# Patient Record
Sex: Female | Born: 1955
Health system: Southern US, Community
[De-identification: ages and names within clinical notes are randomized; demographics above are authoritative.]

## PROBLEM LIST (undated history)

## (undated) DIAGNOSIS — T7840XA Allergy, unspecified, initial encounter: Secondary | ICD-10-CM

## (undated) DIAGNOSIS — E079 Disorder of thyroid, unspecified: Secondary | ICD-10-CM

## (undated) HISTORY — PX: HERNIA REPAIR: SHX51

## (undated) HISTORY — DX: Allergy, unspecified, initial encounter: T78.40XA

## (undated) HISTORY — DX: Disorder of thyroid, unspecified: E07.9

---

## 1997-11-11 ENCOUNTER — Ambulatory Visit: Admission: RE | Admit: 1997-11-11 | Discharge: 1997-11-11 | Payer: Self-pay

## 1999-07-22 ENCOUNTER — Ambulatory Visit (HOSPITAL_COMMUNITY): Admission: RE | Admit: 1999-07-22 | Discharge: 1999-07-22 | Payer: Self-pay | Admitting: Family Medicine

## 1999-07-22 ENCOUNTER — Encounter: Payer: Self-pay | Admitting: Family Medicine

## 2000-08-31 ENCOUNTER — Emergency Department (HOSPITAL_COMMUNITY): Admission: EM | Admit: 2000-08-31 | Discharge: 2000-08-31 | Payer: Self-pay | Admitting: Emergency Medicine

## 2000-10-11 ENCOUNTER — Encounter: Admission: RE | Admit: 2000-10-11 | Discharge: 2000-10-11 | Payer: Self-pay | Admitting: Family Medicine

## 2000-10-11 ENCOUNTER — Encounter: Payer: Self-pay | Admitting: Family Medicine

## 2000-10-17 ENCOUNTER — Ambulatory Visit (HOSPITAL_COMMUNITY): Admission: RE | Admit: 2000-10-17 | Discharge: 2000-10-17 | Payer: Self-pay | Admitting: *Deleted

## 2000-10-17 ENCOUNTER — Encounter (INDEPENDENT_AMBULATORY_CARE_PROVIDER_SITE_OTHER): Payer: Self-pay | Admitting: *Deleted

## 2000-10-17 ENCOUNTER — Encounter: Payer: Self-pay | Admitting: *Deleted

## 2001-03-29 ENCOUNTER — Encounter: Admission: RE | Admit: 2001-03-29 | Discharge: 2001-03-29 | Payer: Self-pay | Admitting: Family Medicine

## 2001-03-29 ENCOUNTER — Encounter: Payer: Self-pay | Admitting: Family Medicine

## 2001-05-07 ENCOUNTER — Encounter: Admission: RE | Admit: 2001-05-07 | Discharge: 2001-05-07 | Payer: Self-pay | Admitting: Endocrinology

## 2001-05-07 ENCOUNTER — Encounter: Payer: Self-pay | Admitting: Endocrinology

## 2003-12-29 ENCOUNTER — Other Ambulatory Visit: Admission: RE | Admit: 2003-12-29 | Discharge: 2003-12-29 | Payer: Self-pay | Admitting: Family Medicine

## 2005-02-24 ENCOUNTER — Other Ambulatory Visit: Admission: RE | Admit: 2005-02-24 | Discharge: 2005-02-24 | Payer: Self-pay | Admitting: Family Medicine

## 2008-11-21 ENCOUNTER — Emergency Department (HOSPITAL_BASED_OUTPATIENT_CLINIC_OR_DEPARTMENT_OTHER): Admission: EM | Admit: 2008-11-21 | Discharge: 2008-11-21 | Payer: Self-pay | Admitting: Emergency Medicine

## 2008-11-21 ENCOUNTER — Ambulatory Visit: Payer: Self-pay | Admitting: Diagnostic Radiology

## 2010-01-06 ENCOUNTER — Ambulatory Visit: Payer: Self-pay | Admitting: Family Medicine

## 2010-01-06 DIAGNOSIS — E039 Hypothyroidism, unspecified: Secondary | ICD-10-CM | POA: Insufficient documentation

## 2010-01-07 LAB — CONVERTED CEMR LAB: TSH: 0.919 microintl units/mL (ref 0.350–4.500)

## 2010-02-03 ENCOUNTER — Ambulatory Visit: Payer: Self-pay | Admitting: Family Medicine

## 2010-02-03 DIAGNOSIS — R030 Elevated blood-pressure reading, without diagnosis of hypertension: Secondary | ICD-10-CM

## 2010-02-03 DIAGNOSIS — I10 Essential (primary) hypertension: Secondary | ICD-10-CM | POA: Insufficient documentation

## 2010-06-08 NOTE — Assessment & Plan Note (Signed)
Summary: CHECK UP AND BLOODWORK/JBB   Vital Signs:  Patient profile:   55 year old female Height:      60 inches Weight:      125 pounds O2 Sat:      100 % on Room air Temp:     98.3 degrees F oral Pulse rate:   78 / minute Pulse rhythm:   regular Resp:     18 per minute BP sitting:   149 / 93  (right arm)  Vitals Entered By: Levonne Spiller EMT-P (February 03, 2010 11:37 AM)  O2 Flow:  Room air  Reason for Visit Follow-up  Allergies: 1)  ! * Archivist PMH-FH-SH reviewed for relevance   Complete Medication List: 1)  Levothroid 50 Mcg Tabs (Levothyroxine sodium) .Marland Kitchen.. 1 by mouth qam for thyroid Prescriptions: LEVOTHROID 50 MCG TABS (LEVOTHYROXINE SODIUM) 1 by mouth qam for thyroid  #90 x 2   Entered and Authorized by:   Tacey Ruiz MD   Signed by:   Tacey Ruiz MD on 02/03/2010   Method used:   Print then Give to Patient   RxID:   [id number]   History of Present Illness History from: patient Reason for visit: see chief complaint Chief Complaint: f/u hypothyroid History of Present Illness: She was restarted on levothyroxine about 6 weeks ago by this office. She returnted today for f/u. After she left, last visit, we received her lab results that revealed that she actually had a normal thyroid. The patient, however, reported feeling 100% better and has continued to take the prescription. She reports knowing the symptoms and risks of become hyperthyroid, but reports that she is willing to take that chance.   She says, however, that she cannot afford blood work today.    Physical Exam General appearance: well developed, well nourished, no acute distress Eyes: conjunctivae and lids normal Pupils: equal, round, reactive to light Neck: neck supple,  trachea midline, no masses Thyroid: diffuse enlargement Chest/Lungs: no rales, wheezes, or rhonchi bilateral, breath sounds equal without effort Heart: regular rate and  rhythm, no murmur Neurological: grossly intact and  non-focal - patellar reflexes 2+ equal Skin: no obvious rashes or lesions MSE: oriented to time, place, and person   REVIEW OF SYSTEMS Constitutional Symptoms      Denies fever, chills, night sweats, weight loss, weight gain, and fatigue.  Eyes       Denies change in vision, eye pain, eye discharge, glasses, contact lenses, and eye surgery. Ear/Nose/Throat/Mouth       Denies hearing loss/aids, change in hearing, ear pain, ear discharge, dizziness, frequent runny nose, frequent nose bleeds, sinus problems, sore throat, hoarseness, and tooth pain or bleeding.  Respiratory       Denies dry cough, productive cough, wheezing, shortness of breath, asthma, bronchitis, and emphysema/COPD.  Cardiovascular       Denies murmurs, chest pain, and tires easily with exhertion.    Gastrointestinal       Denies stomach pain, nausea/vomiting, diarrhea, constipation, blood in bowel movements, and indigestion. Genitourniary       Denies painful urination, kidney stones, and loss of urinary control. Neurological       Denies paralysis, seizures, and fainting/blackouts. Musculoskeletal       Denies muscle pain, joint pain, joint stiffness, decreased range of motion, redness, swelling, muscle weakness, and gout.  Skin       Denies bruising, unusual mles/lumps or sores, and hair/skin or nail changes.  Psych  Complains of mood changes.      Denies temper/anger issues, anxiety/stress, speech problems, depression, and sleep problems.      Comments: improved!  Assessment New Problems: ELEVATED BP READING WITHOUT DX HYPERTENSION (ICD-796.2)   Plan New Medications/Changes: LEVOTHROID 50 MCG TABS (LEVOTHYROXINE SODIUM) 1 by mouth qam for thyroid  #90 x 2, 02/03/2010, Tacey Ruiz MD  New Orders: Est. Patient Level II [phone] Planning Comments:   Patient was again urged to get labwork in the next 2-3 months. She was cautioned on symptoms of hyperthyroidism and possible long term but she again is willing  to accept the risks.  She was also counseled on high blood pressure, decreasing salt intake and exercise. She was urged to check her blood pressures regularly (Qmonth) and to return if pattern of elevation continues. She voiced understanding.   The patient and/or caregiver has been counseled thoroughly with regard to medications prescribed including dosage, schedule, interactions, rationale for use, and possible side effects and they verbalize understanding.  Diagnoses and expected course of recovery discussed and will return if not improved as expected or if the condition worsens. Patient and/or caregiver verbalized understanding.   The patient was informed that there is no on-call provider or services available at this clinic during off-hours (when the clinic is closed).  If the patient developed a problem or concern that required immediate attention, the patient was advised to go the the nearest available urgent care or emergency department for medical care.  The patient verbalized understanding.     The risks, benefits and possible side effects of the treatments and tests were explained clearly to the patient and the patient verbalized understanding.

## 2010-06-08 NOTE — Assessment & Plan Note (Signed)
Summary: THYROID MEDICATION/JBB   Vital Signs:  Patient Profile:   55 Years Old Female CC:      Rx Refill, Tdap injection / rwt Height:     61 inches Weight:      121 pounds BMI:     22.95 O2 Sat:      99 % O2 treatment:    Room Air Temp:     97.3 degrees F oral Pulse rate:   65 / minute Pulse rhythm:   regular Resp:     18 per minute BP sitting:   147 / 83  (left arm)  Pt. in pain?   no  Vitals Entered By: Levonne Spiller EMT-P (January 06, 2010 12:54 PM)              Is Patient Diabetic? No      Current Allergies: ! * FIRE ANTS History of Present Illness History from: patient Reason for visit: see chief complaint Chief Complaint: Rx Refill, Tdap injection / rwt History of Present Illness: patient has not seen a doctor in years. She does not have insurance and is without a job for years. She used to take throid medication daily but has not had any x about 3 years, secondary to above reasons. She has been starting to feel very sluggish, fatigued, and "blah". She comes in today, because she states that she cannot take it any more. Denies SI/HI.   She also tends to horses and reports being bit by one recently and having a puncture wound from a horse shoe while changing it. She would like a tetnus shot.  REVIEW OF SYSTEMS Constitutional Symptoms       Complains of weight gain and fatigue.     Denies fever, chills, night sweats, and weight loss.  Eyes       Complains of change in vision and glasses.      Denies eye pain, eye discharge, contact lenses, and eye surgery.      Comments: blurred vision Ear/Nose/Throat/Mouth       Denies hearing loss/aids, change in hearing, ear pain, ear discharge, dizziness, frequent runny nose, frequent nose bleeds, sinus problems, sore throat, hoarseness, and tooth pain or bleeding.  Respiratory       Denies dry cough, productive cough, wheezing, shortness of breath, asthma, bronchitis, and emphysema/COPD.  Cardiovascular       Complains of  tires easily with exhertion.      Denies murmurs and chest pain.    Gastrointestinal       Denies stomach pain, nausea/vomiting, diarrhea, constipation, blood in bowel movements, and indigestion. Genitourniary       Denies painful urination, blood or discharge from vagina, kidney stones, and loss of urinary control. Neurological       Denies paralysis, seizures, and fainting/blackouts.   Psych       Complains of depression.      Denies mood changes, temper/anger issues, anxiety/stress, speech problems, and sleep problems.  Family History: Family History Diabetes 1st degree relative (father)  Social History: quit smoking - 15 years ago Alcohol use-yes occ Physical Exam General appearance: well developed, well nourished, no acute distress Oral/Pharynx: tongue normal, posterior pharynx without erythema or exudate Neck: neck supple,  trachea midline, no masses Thyroid: no nodules, masses, or tenderness. slightly enlarged throid. Chest/Lungs: no rales, wheezes, or rhonchi bilateral, breath sounds equal without effort Heart: regular rate and  rhythm, no murmur Skin: several clean scars on legs/arms. nontender MSE: oriented to time, place, and person  Assessment New Problems: NEED PROPH VACCINATION W/TETANUS TOXOID ALONE (ICD-V03.7) UNSPECIFIED HYPOTHYROIDISM (ICD-244.9) FAMILY HISTORY DIABETES 1ST DEGREE RELATIVE (ICD-V18.0)   Plan New Medications/Changes: LEVOTHROID 50 MCG TABS (LEVOTHYROXINE SODIUM) 1 by mouth qam for thyroid  #30 x 1, 01/06/2010, Tacey Ruiz MD  New Orders: New Patient Level II [99202] Tetanus Toxoid w/Dx [86578] T-TSH [46962-95284] Planning Comments:   Was told to return to clinic in 5-6 weeks to have TSH level re-checked.   The patient and/or caregiver has been counseled thoroughly with regard to medications prescribed including dosage, schedule, interactions, rationale for use, and possible side effects and they verbalize understanding.  Diagnoses and  expected course of recovery discussed and will return if not improved as expected or if the condition worsens. Patient and/or caregiver verbalized understanding.  Prescriptions: LEVOTHROID 50 MCG TABS (LEVOTHYROXINE SODIUM) 1 by mouth qam for thyroid  #30 x 1   Entered and Authorized by:   Tacey Ruiz MD   Signed by:   Tacey Ruiz MD on 01/06/2010   Method used:   Electronically to        [facility] (retail)       [address]       [place], [place]  [address]       Ph: [phone]       Fax: [phone]   RxID:   [id number]   Medication Administration  Injection # 1:    Medication: TD    Diagnosis: Booster    Route: IM    Site: L deltoid    Exp Date: 03/24/2011    Lot #: [id number]    Mfr: Sanofi Pasteur    Patient tolerated injection without complications    Given by: Levonne Spiller EMT-P (January 06, 2010 1:39 PM)  Orders Added: 1)  New Patient Level II [99202] 2)  Tetanus Toxoid w/Dx [01601] 3)  T-TSH [09323-55732]  The risks, benefits and possible side effects of the treatments and tests were explained clearly to the patient and the patient verbalized understanding.  The patient was informed that there is no on-call provider or services available at this clinic during off-hours (when the clinic is closed).  If the patient developed a problem or concern that required immediate attention, the patient was advised to go the the nearest available urgent care or emergency department for medical care.  The patient verbalized understanding.

## 2012-11-08 ENCOUNTER — Emergency Department: Payer: Self-pay | Admitting: Emergency Medicine

## 2012-11-17 ENCOUNTER — Ambulatory Visit (INDEPENDENT_AMBULATORY_CARE_PROVIDER_SITE_OTHER): Payer: BC Managed Care – PPO | Admitting: Family Medicine

## 2012-11-17 VITALS — BP 132/90 | HR 70 | Temp 97.9°F | Resp 18 | Ht 61.0 in | Wt 136.0 lb

## 2012-11-17 DIAGNOSIS — R635 Abnormal weight gain: Secondary | ICD-10-CM

## 2012-11-17 DIAGNOSIS — Z862 Personal history of diseases of the blood and blood-forming organs and certain disorders involving the immune mechanism: Secondary | ICD-10-CM

## 2012-11-17 DIAGNOSIS — S7001XA Contusion of right hip, initial encounter: Secondary | ICD-10-CM

## 2012-11-17 DIAGNOSIS — S7011XA Contusion of right thigh, initial encounter: Secondary | ICD-10-CM

## 2012-11-17 DIAGNOSIS — Z0000 Encounter for general adult medical examination without abnormal findings: Secondary | ICD-10-CM

## 2012-11-17 DIAGNOSIS — N959 Unspecified menopausal and perimenopausal disorder: Secondary | ICD-10-CM

## 2012-11-17 DIAGNOSIS — S7010XA Contusion of unspecified thigh, initial encounter: Secondary | ICD-10-CM

## 2012-11-17 DIAGNOSIS — R030 Elevated blood-pressure reading, without diagnosis of hypertension: Secondary | ICD-10-CM

## 2012-11-17 DIAGNOSIS — Z8639 Personal history of other endocrine, nutritional and metabolic disease: Secondary | ICD-10-CM

## 2012-11-17 LAB — LIPID PANEL
Cholesterol: 205 mg/dL — ABNORMAL HIGH (ref 0–200)
HDL: 76 mg/dL (ref >39)
LDL Cholesterol: 113 mg/dL — ABNORMAL HIGH (ref 0–99)
Total CHOL/HDL Ratio: 2.7 Ratio
Triglycerides: 78 mg/dL (ref <150)
VLDL: 16 mg/dL (ref 0–40)

## 2012-11-17 LAB — COMPREHENSIVE METABOLIC PANEL
ALT: 19 U/L (ref 0–35)
AST: 22 U/L (ref 0–37)
Albumin: 4.4 g/dL (ref 3.5–5.2)
Alkaline Phosphatase: 78 U/L (ref 39–117)
BUN: 13 mg/dL (ref 6–23)
CO2: 29 mEq/L (ref 19–32)
Calcium: 9.6 mg/dL (ref 8.4–10.5)
Chloride: 103 mEq/L (ref 96–112)
Creat: 1.05 mg/dL (ref 0.50–1.10)
Glucose, Bld: 91 mg/dL (ref 70–99)
Potassium: 3.9 mEq/L (ref 3.5–5.3)
Sodium: 141 mEq/L (ref 135–145)
Total Bilirubin: 0.7 mg/dL (ref 0.3–1.2)
Total Protein: 7.5 g/dL (ref 6.0–8.3)

## 2012-11-17 LAB — POCT CBC
Granulocyte percent: 68.8 %G (ref 37–80)
HCT, POC: 44.8 % (ref 37.7–47.9)
Hemoglobin: 14.4 g/dL (ref 12.2–16.2)
Lymph, poc: 1.8 (ref 0.6–3.4)
MCH, POC: 29.2 pg (ref 27–31.2)
MCHC: 32.1 g/dL (ref 31.8–35.4)
MCV: 90.8 fL (ref 80–97)
MID (cbc): 0.5 (ref 0–0.9)
MPV: 7.8 fL (ref 0–99.8)
POC Granulocyte: 5 (ref 2–6.9)
POC LYMPH PERCENT: 24.2 %L (ref 10–50)
POC MID %: 7 %M (ref 0–12)
Platelet Count, POC: 320 10*3/uL (ref 142–424)
RBC: 4.93 M/uL (ref 4.04–5.48)
RDW, POC: 14 %
WBC: 7.3 10*3/uL (ref 4.6–10.2)

## 2012-11-17 LAB — TSH: TSH: 1.077 u[IU]/mL (ref 0.350–4.500)

## 2012-11-17 LAB — POCT GLYCOSYLATED HEMOGLOBIN (HGB A1C): Hemoglobin A1C: 5.2

## 2012-11-17 LAB — IFOBT (OCCULT BLOOD): IFOBT: NEGATIVE

## 2012-11-17 MED ORDER — HYDROCODONE-ACETAMINOPHEN 5-325 MG PO TABS: 1.0000 | ORAL_TABLET | Freq: Four times a day (QID) | ORAL | Status: DC | PRN

## 2012-11-17 NOTE — Patient Instructions (Addendum)
Call Dr. Loreta Ave or Dr Elnoria Howard, gastroenterology, to schedule screening colonoscopy  [phone]  [facility] to schedule screening mammogram  [phone]   If the contusion and hematoma on the hip get acutely redder or more painful get them rechecked.  Continue to monitor your blood pressure. No treatment is needed at this time. She needs to work on weight loss I continue to try and exercise regularly and to eat less  Return if further problems arise.
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
## 2012-11-19 LAB — PAP IG (IMAGE GUIDED)

## 2013-09-12 ENCOUNTER — Ambulatory Visit (INDEPENDENT_AMBULATORY_CARE_PROVIDER_SITE_OTHER): Payer: 59 | Admitting: Family Medicine

## 2013-09-12 VITALS — BP 124/74 | HR 63 | Temp 98.1°F | Resp 16 | Ht 61.0 in | Wt 112.2 lb

## 2013-09-12 DIAGNOSIS — Z0000 Encounter for general adult medical examination without abnormal findings: Secondary | ICD-10-CM

## 2013-09-12 DIAGNOSIS — E079 Disorder of thyroid, unspecified: Secondary | ICD-10-CM

## 2013-09-12 DIAGNOSIS — Z1231 Encounter for screening mammogram for malignant neoplasm of breast: Secondary | ICD-10-CM

## 2013-09-12 DIAGNOSIS — Z1211 Encounter for screening for malignant neoplasm of colon: Secondary | ICD-10-CM

## 2013-09-12 DIAGNOSIS — Z13220 Encounter for screening for lipoid disorders: Secondary | ICD-10-CM

## 2013-09-12 LAB — POCT UA - MICROSCOPIC ONLY
Bacteria, U Microscopic: NEGATIVE
Casts, Ur, LPF, POC: NEGATIVE
Crystals, Ur, HPF, POC: NEGATIVE
Mucus, UA: NEGATIVE
RBC, urine, microscopic: NEGATIVE
WBC, Ur, HPF, POC: NEGATIVE
Yeast, UA: NEGATIVE

## 2013-09-12 LAB — COMPREHENSIVE METABOLIC PANEL
ALT: 14 U/L (ref 0–35)
AST: 16 U/L (ref 0–37)
Albumin: 4.4 g/dL (ref 3.5–5.2)
Alkaline Phosphatase: 69 U/L (ref 39–117)
BUN: 16 mg/dL (ref 6–23)
CO2: 28 mEq/L (ref 19–32)
Calcium: 9.3 mg/dL (ref 8.4–10.5)
Chloride: 103 mEq/L (ref 96–112)
Creat: 0.9 mg/dL (ref 0.50–1.10)
Glucose, Bld: 93 mg/dL (ref 70–99)
Potassium: 3.9 mEq/L (ref 3.5–5.3)
Sodium: 140 mEq/L (ref 135–145)
Total Bilirubin: 0.7 mg/dL (ref 0.2–1.2)
Total Protein: 7.2 g/dL (ref 6.0–8.3)

## 2013-09-12 LAB — LIPID PANEL
Cholesterol: 174 mg/dL (ref 0–200)
HDL: 78 mg/dL (ref >39)
LDL Cholesterol: 85 mg/dL (ref 0–99)
Total CHOL/HDL Ratio: 2.2 Ratio
Triglycerides: 57 mg/dL (ref <150)
VLDL: 11 mg/dL (ref 0–40)

## 2013-09-12 LAB — POCT URINALYSIS DIPSTICK
Bilirubin, UA: NEGATIVE
Blood, UA: NEGATIVE
Glucose, UA: NEGATIVE
Ketones, UA: NEGATIVE
Leukocytes, UA: NEGATIVE
Nitrite, UA: NEGATIVE
Protein, UA: NEGATIVE
Spec Grav, UA: <=1.005
Urobilinogen, UA: 0.2
pH, UA: 7

## 2013-09-12 LAB — POCT CBC
Granulocyte percent: 69.9 %G (ref 37–80)
HCT, POC: 43.7 % (ref 37.7–47.9)
Hemoglobin: 14.3 g/dL (ref 12.2–16.2)
Lymph, poc: 1.8 (ref 0.6–3.4)
MCH, POC: 29.2 pg (ref 27–31.2)
MCHC: 32.7 g/dL (ref 31.8–35.4)
MCV: 89.4 fL (ref 80–97)
MID (cbc): 0.4 (ref 0–0.9)
MPV: 8.3 fL (ref 0–99.8)
POC Granulocyte: 5.2 (ref 2–6.9)
POC LYMPH PERCENT: 24.4 %L (ref 10–50)
POC MID %: 6 %M (ref 0–12)
Platelet Count, POC: 324 10*3/uL (ref 142–424)
RBC: 4.89 M/uL (ref 4.04–5.48)
RDW, POC: 14.8 %
WBC: 7.4 10*3/uL (ref 4.6–10.2)

## 2013-09-12 LAB — TSH: TSH: 0.767 u[IU]/mL (ref 0.350–4.500)

## 2013-09-12 NOTE — Progress Notes (Signed)
Chief Complaint:  Chief Complaint  Patient presents with  . Annual Exam    for insurance     HPI: Carrie Beard is a 58 y.o. female who is here for annual visit She is doing well. 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 Allergy   . Thyroid disease    Past Surgical History  Procedure Laterality Date  . Hernia repair     History   Social History  . Marital Status: Single    Spouse Name: N/A    Number of Children: N/A  . Years of Education: N/A   Social History Main Topics  . Smoking status: Former Games developermoker  . Smokeless tobacco: None  . Alcohol Use: Yes  . Drug Use: No  . Sexual Activity: No   Other Topics Concern  . None   Social History Narrative  . None   Family History  Problem Relation Age of Onset  . Hypertension Mother   . Diabetes Father    No Known Allergies Prior to Admission medications   Medication Sig Start Date End Date Taking? Authorizing Provider  HYDROcodone-acetaminophen (NORCO) 5-325 MG per tablet Take 1 tablet by mouth every 6 (six) hours as needed for pain. One every 4-6 hours for severe pain only. 11/17/12   Peyton Najjaravid H Hopper, MD  oxyCODONE-acetaminophen (PERCOCET/ROXICET) 5-325 MG per tablet Take 1 tablet by mouth every 4 (four) hours as needed for pain.    Historical Provider, MD     ROS: The patient denies fevers, chills, night sweats, unintentional weight loss, chest pain, palpitations, wheezing, dyspnea on exertion, nausea, vomiting, abdominal pain, dysuria, hematuria, melena, numbness, weakness, or tingling.   All other systems have been reviewed and were otherwise negative with the exception of those mentioned in the HPI and as above.    PHYSICAL EXAM: Filed  Vitals:   09/12/13 0813  BP: 124/74  Pulse: 63  Temp: 98.1 F (36.7 C)  Resp: 16   Filed Vitals:   09/12/13 0813  Height: 5\' 1"  (1.549 m)  Weight: 112 lb 3.2 oz (50.894 kg)   Body mass index is 21.21 kg/(m^2).  General: Alert, no acute distress HEENT:  Normocephalic, atraumatic, oropharynx patent. EOMI, PERRLA Cardiovascular:  Regular rate and rhythm, no rubs murmurs or gallops.  No Carotid bruits, radial pulse intact. No pedal edema.  Respiratory: Clear to auscultation bilaterally.  No wheezes, rales, or rhonchi.  No cyanosis, no use of accessory musculature GI: No organomegaly, abdomen is soft and non-tender, positive bowel sounds.  No masses. Skin: No rashes. Neurologic: Facial musculature symmetric. Psychiatric: Patient is appropriate throughout our interaction. Lymphatic: No cervical lymphadenopathy Musculoskeletal: Gait intact. Breast exam normal Normal cervical exam, nulliparous cervix   LABS: Results for orders placed in visit on 09/12/13  POCT CBC      Result Value Ref Range   WBC 7.4  4.6 - 10.2 K/uL   Lymph, poc 1.8  0.6 - 3.4   POC LYMPH PERCENT 24.4  10 - 50 %L   MID (cbc) 0.4  0 - 0.9   POC MID % 6.0  0 - 12 %M   POC Granulocyte 5.2  2 - 6.9   Granulocyte percent 69.9  37 - 80 %G   RBC 4.89  4.04 - 5.48 M/uL   Hemoglobin 14.3  12.2 - 16.2 g/dL   HCT, POC 11.943.7  14.737.7 - 47.9 %   MCV 89.4  80 - 97 fL   MCH, POC 29.2  27 - 31.2 pg   MCHC 32.7  31.8 - 35.4 g/dL   RDW, POC 82.914.8     Platelet Count, POC 324  142 - 424 K/uL   MPV 8.3  0 - 99.8 fL  POCT UA - MICROSCOPIC ONLY      Result Value Ref Range   WBC, Ur, HPF, POC neg     RBC, urine, microscopic neg     Bacteria, U Microscopic neg     Mucus, UA neg     Epithelial cells, urine per micros 0-1     Crystals, Ur, HPF, POC neg     Casts, Ur, LPF, POC neg     Yeast, UA neg    POCT URINALYSIS DIPSTICK      Result Value Ref Range   Color, UA yellow     Clarity, UA clear     Glucose, UA neg      Bilirubin, UA neg     Ketones, UA neg     Spec Grav, UA <=1.005     Blood, UA neg     pH, UA 7.0     Protein, UA neg     Urobilinogen, UA 0.2     Nitrite, UA neg     Leukocytes, UA Negative       EKG/XRAY:   [facility]   ASSESSMENT/PLAN: Encounter Diagnoses  Name Primary?  . Annual physical exam Yes  . Screening for hyperlipidemia   . Thyroid disease   . Other screening mammogram   . Encounter for screening colonoscopy    Well woman exam  She is doing well Will get annual labs ad screening mammogram and also colonscopy Pap was normal in 2014 so will not get pap She has an insurance sheet that needs to be faxed.  F/u in 1 year  Gross sideeffects, risk and benefits, and alternatives of medications d/w patient. Patient is aware that all medications have potential sideeffects and we are unable to predict every sideeffect or drug-drug interaction that may occur.  Lenell Antuhao P Le, DO 09/12/2013 9:27 AM

## 2013-09-13 LAB — VITAMIN D 25 HYDROXY (VIT D DEFICIENCY, FRACTURES): Vit D, 25-Hydroxy: 26 ng/mL — ABNORMAL LOW (ref 30–89)

## 2013-10-04 ENCOUNTER — Telehealth: Payer: Self-pay | Admitting: *Deleted

## 2013-10-04 NOTE — Telephone Encounter (Signed)
[employer] form received from pt at May 7th visit needs MD signature and faxed back to [employer] before June 1.  Form brought down to Dr. Conley Rolls to sign and faxed to [employer]- sent to scanning.

## 2014-10-29 ENCOUNTER — Ambulatory Visit (INDEPENDENT_AMBULATORY_CARE_PROVIDER_SITE_OTHER): Payer: 59 | Admitting: Emergency Medicine

## 2014-10-29 VITALS — BP 150/80 | HR 75 | Temp 98.4°F | Resp 18 | Ht 61.0 in | Wt 122.0 lb

## 2014-10-29 DIAGNOSIS — R030 Elevated blood-pressure reading, without diagnosis of hypertension: Secondary | ICD-10-CM

## 2014-10-29 DIAGNOSIS — Z0000 Encounter for general adult medical examination without abnormal findings: Secondary | ICD-10-CM | POA: Diagnosis not present

## 2014-10-29 DIAGNOSIS — Z1329 Encounter for screening for other suspected endocrine disorder: Secondary | ICD-10-CM | POA: Diagnosis not present

## 2014-10-29 LAB — COMPLETE METABOLIC PANEL WITH GFR
ALT: 12 U/L (ref 0–35)
AST: 16 U/L (ref 0–37)
Albumin: 4.3 g/dL (ref 3.5–5.2)
Alkaline Phosphatase: 71 U/L (ref 39–117)
BUN: 16 mg/dL (ref 6–23)
CO2: 27 mEq/L (ref 19–32)
Calcium: 9.2 mg/dL (ref 8.4–10.5)
Chloride: 104 mEq/L (ref 96–112)
Creat: 0.96 mg/dL (ref 0.50–1.10)
GFR, Est African American: 76 mL/min
GFR, Est Non African American: 66 mL/min
Glucose, Bld: 100 mg/dL — ABNORMAL HIGH (ref 70–99)
Potassium: 4.2 mEq/L (ref 3.5–5.3)
Sodium: 139 mEq/L (ref 135–145)
Total Bilirubin: 0.7 mg/dL (ref 0.2–1.2)
Total Protein: 7.1 g/dL (ref 6.0–8.3)

## 2014-10-29 LAB — LIPID PANEL
Cholesterol: 196 mg/dL (ref 0–200)
HDL: 101 mg/dL (ref >=46)
LDL Cholesterol: 81 mg/dL (ref 0–99)
Total CHOL/HDL Ratio: 1.9 Ratio
Triglycerides: 71 mg/dL (ref <150)
VLDL: 14 mg/dL (ref 0–40)

## 2014-10-29 LAB — POCT URINALYSIS DIPSTICK
Bilirubin, UA: NEGATIVE
Blood, UA: NEGATIVE
Glucose, UA: NEGATIVE
Ketones, UA: NEGATIVE
Nitrite, UA: NEGATIVE
Protein, UA: NEGATIVE
Spec Grav, UA: <=1.005
Urobilinogen, UA: 0.2
pH, UA: 6

## 2014-10-29 LAB — POCT CBC
Granulocyte percent: 64.7 %G (ref 37–80)
HCT, POC: 41.9 % (ref 37.7–47.9)
Hemoglobin: 13.4 g/dL (ref 12.2–16.2)
Lymph, poc: 1.8 (ref 0.6–3.4)
MCH, POC: 27.8 pg (ref 27–31.2)
MCHC: 32.1 g/dL (ref 31.8–35.4)
MCV: 86.5 fL (ref 80–97)
MID (cbc): 0.4 (ref 0–0.9)
MPV: 6.6 fL (ref 0–99.8)
POC Granulocyte: 3.9 (ref 2–6.9)
POC LYMPH PERCENT: 29.2 %L (ref 10–50)
POC MID %: 6.1 %M (ref 0–12)
Platelet Count, POC: 253 10*3/uL (ref 142–424)
RBC: 4.84 M/uL (ref 4.04–5.48)
RDW, POC: 13.6 %
WBC: 6.1 10*3/uL (ref 4.6–10.2)

## 2014-10-29 LAB — POCT UA - MICROSCOPIC ONLY
Casts, Ur, LPF, POC: NEGATIVE
Crystals, Ur, HPF, POC: NEGATIVE
Mucus, UA: NEGATIVE
RBC, urine, microscopic: NEGATIVE
Yeast, UA: NEGATIVE

## 2014-10-29 LAB — TSH: TSH: 0.936 u[IU]/mL (ref 0.350–4.500)

## 2014-10-29 NOTE — Patient Instructions (Addendum)
We recommend that you schedule a mammogram for breast cancer screening. Typically, you do not need a referral to do this. Please contact a local imaging center to schedule your mammogram.  [facility] - [phone]  *ask for the Radiology Department [facility]) - [phone] or [phone]  [facility] - [phone] [facility] - [phone] [facility] - [phone]  *ask for the Radiology Department [facility] - [phone]  *ask for the Radiology Department [facility] - [phone]  *ask for the Mammography Department [facility] - [phone] Hypertension Hypertension, commonly called high blood pressure, is when the force of blood pumping through your arteries is too strong. Your arteries are the blood vessels that carry blood from your heart throughout your body. A blood pressure reading consists of a higher number over a lower number, such as 110/72. The higher number (systolic) is the pressure inside your arteries when your heart pumps. The lower number (diastolic) is the pressure inside your arteries when your heart relaxes. Ideally you want your blood pressure below 120/80. Hypertension forces your heart to work harder to pump blood. Your arteries may become narrow or stiff. Having hypertension puts you at risk for heart disease, stroke, and other problems.  RISK FACTORS Some risk factors for high blood pressure are controllable. Others are not.  Risk factors you cannot control include:   Race. You may be at higher risk if you are African American.  Age. Risk increases with age.  Gender. Men are at higher risk than women before age 88 years. After age 1, women are at higher risk than men. Risk factors you can control include:  Not getting enough exercise or physical activity.  Being overweight.  Getting too much fat, sugar, calories, or salt in your  diet.  Drinking too much alcohol. SIGNS AND SYMPTOMS Hypertension does not usually cause signs or symptoms. Extremely high blood pressure (hypertensive crisis) may cause headache, anxiety, shortness of breath, and nosebleed. DIAGNOSIS  To check if you have hypertension, your health care provider will measure your blood pressure while you are seated, with your arm held at the level of your heart. It should be measured at least twice using the same arm. Certain conditions can cause a difference in blood pressure between your right and left arms. A blood pressure reading that is higher than normal on one occasion does not mean that you need treatment. If one blood pressure reading is high, ask your health care provider about having it checked again. TREATMENT  Treating high blood pressure includes making lifestyle changes and possibly taking medicine. Living a healthy lifestyle can help lower high blood pressure. You may need to change some of your habits. Lifestyle changes may include:  Following the DASH diet. This diet is high in fruits, vegetables, and whole grains. It is low in salt, red meat, and added sugars.  Getting at least 2 hours of brisk physical activity every week.  Losing weight if necessary.  Not smoking.  Limiting alcoholic beverages.  Learning ways to reduce stress. If lifestyle changes are not enough to get your blood pressure under control, your health care provider may prescribe medicine. You may need to take more than one. Work closely with your health care provider to understand the risks and benefits. HOME CARE INSTRUCTIONS  Have your blood pressure rechecked as directed by your health care provider.   Take medicines only as directed  by your health care provider. Follow the directions carefully. Blood pressure medicines must be taken as prescribed. The medicine does not work as well when you skip doses. Skipping doses also puts you at risk for problems.   Do not  smoke.   Monitor your blood pressure at home as directed by your health care provider. SEEK MEDICAL CARE IF:   You think you are having a reaction to medicines taken.  You have recurrent headaches or feel dizzy.  You have swelling in your ankles.  You have trouble with your vision. SEEK IMMEDIATE MEDICAL CARE IF:  You develop a severe headache or confusion.  You have unusual weakness, numbness, or feel faint.  You have severe chest or abdominal pain.  You vomit repeatedly.  You have trouble breathing. MAKE SURE YOU:   Understand these instructions.  Will watch your condition.  Will get help right away if you are not doing well or get worse. Document Released: 04/25/2005 Document Revised: 09/09/2013 Document Reviewed: 02/15/2013 [facility] Patient Information 2015 [place], [place]. This information is not intended to replace advice given to you by your health care provider. Make sure you discuss any questions you have with your health care provider.

## 2014-10-29 NOTE — Progress Notes (Addendum)
Subjective:  This chart was scribed for Earl Lites, MD by Andrew Au, ED Scribe. This patient was seen in room 13 and the patient's care was started at 8:25 AM.  Patient ID: Carrie Beard, female    DOB: 20-Aug-1955, 59 y.o.   MRN: [id number]  HPI Chief Complaint  Patient presents with  . Annual Exam   HPI Comments: Carrie Beard is a 59 y.o. female who presents to the [facility] for an insurance physical. Pt is UTD on immunization. She had colonoscopy this year by Dr. Loreta Ave and pap smear was last year by Dr. Conley Rolls.  She has not yet scheduled her mammogram but plans to do so soon. She is seen once a year for a physical. She reports family hx of DM, HTN, colon cancer, and brain cancer. Pt is fasting this morning. 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   Past Medical History  Diagnosis Date  . Allergy   . Thyroid disease    Past Surgical History  Procedure Laterality Date  . Hernia repair     No Known Allergies Prior to Admission medications   Medication Sig Start Date End Date Taking? Authorizing Provider  cyclobenzaprine (FLEXERIL) 10 MG tablet Take 10 mg by mouth 3 (three) times daily as needed for muscle spasms.   Yes Historical Provider, MD  naproxen (NAPROSYN) 500 MG tablet Take 500 mg by mouth 2 (two) times daily with a meal.   Yes Historical Provider, MD  HYDROcodone-acetaminophen (NORCO) 5-325 MG per tablet Take 1 tablet by mouth every 6 (six) hours as needed for pain. One every 4-6 hours for severe pain only. Patient not taking: Reported on 10/29/2014 11/17/12   Peyton Najjar, MD  oxyCODONE-acetaminophen (PERCOCET/ROXICET) 5-325 MG per tablet Take 1 tablet by mouth every 4 (four) hours as needed for pain.    Historical Provider, MD   Review of Systems  All other systems reviewed and are negative.    Objective:   Physical Exam CONSTITUTIONAL: Well developed/well nourished HEAD: Normocephalic/atraumatic EYES: EOMI/PERRL ENMT: Mucous membranes  moist NECK: supple no meningeal signs SPINE/BACK:entire spine nontender CV: S1/S2 noted, no murmurs/rubs/gallops noted, BP-160/84 LUNGS: Lungs are clear to auscultation bilaterally, no apparent distress ABDOMEN: soft, nontender, no rebound or guarding, bowel sounds noted throughout abdomen GU:no cva tenderness NEURO: Pt is awake/alert/appropriate, moves all extremitiesx4.  No facial droop.   EXTREMITIES: pulses normal/equal, full ROM SKIN: warm, color normal PSYCH: no abnormalities of mood noted, alert and oriented to situation  Filed Vitals:   10/29/14 0812  BP: 160/80  Pulse: 75  Temp: 98.4 F (36.9 C)  Resp: 18   Results for orders placed or performed in visit on 10/29/14  POCT urinalysis dipstick  Result Value Ref Range   Color, UA yellow    Clarity, UA clear    Glucose, UA neg    Bilirubin, UA neg    Ketones, UA neg    Spec Grav, UA <=1.005    Blood, UA neg    pH, UA 6.0    Protein, UA neg    Urobilinogen, UA 0.2    Nitrite, UA neg    Leukocytes, UA small (1+) (A) Negative  POCT CBC  Result Value Ref Range   WBC 6.1 4.6 - 10.2 K/uL   Lymph, poc 1.8 0.6 - 3.4   POC LYMPH PERCENT 29.2 10 - 50 %L   MID (cbc) 0.4 0 - 0.9   POC MID % 6.1 0 -  12 %M   POC Granulocyte 3.9 2 - 6.9   Granulocyte percent 64.7 37 - 80 %G   RBC 4.84 4.04 - 5.48 M/uL   Hemoglobin 13.4 12.2 - 16.2 g/dL   HCT, POC 16.1 09.6 - 47.9 %   MCV 86.5 80 - 97 fL   MCH, POC 27.8 27 - 31.2 pg   MCHC 32.1 31.8 - 35.4 g/dL   RDW, POC 04.5 %   Platelet Count, POC 253 142 - 424 K/uL   MPV 6.6 0 - 99.8 fL  EKG normal sinus rhythm Assessment & Plan:  Patient will check her blood pressure 1 time per week for the next 6 weeks then return to clinic to see me if her systolic continues to run greater than 140.I personally performed the services described in this documentation, which was scribed in my presence. The recorded information has been reviewed and is accurate.  Earl Lites, MD

## 2014-10-30 LAB — HIV ANTIBODY (ROUTINE TESTING W REFLEX): HIV 1&2 Ab, 4th Generation: NONREACTIVE

## 2014-10-31 ENCOUNTER — Encounter: Payer: Self-pay | Admitting: Family Medicine

## 2015-10-12 ENCOUNTER — Ambulatory Visit (INDEPENDENT_AMBULATORY_CARE_PROVIDER_SITE_OTHER): Payer: 59 | Admitting: Physician Assistant

## 2015-10-12 VITALS — BP 140/82 | HR 98 | Temp 98.1°F | Resp 18 | Ht 61.0 in | Wt 130.0 lb

## 2015-10-12 DIAGNOSIS — Z1159 Encounter for screening for other viral diseases: Secondary | ICD-10-CM | POA: Diagnosis not present

## 2015-10-12 DIAGNOSIS — Z13220 Encounter for screening for lipoid disorders: Secondary | ICD-10-CM | POA: Diagnosis not present

## 2015-10-12 DIAGNOSIS — Z13228 Encounter for screening for other metabolic disorders: Secondary | ICD-10-CM | POA: Diagnosis not present

## 2015-10-12 DIAGNOSIS — Z0000 Encounter for general adult medical examination without abnormal findings: Secondary | ICD-10-CM | POA: Diagnosis not present

## 2015-10-12 DIAGNOSIS — M62838 Other muscle spasm: Secondary | ICD-10-CM | POA: Diagnosis not present

## 2015-10-12 DIAGNOSIS — Z130 Encounter for screening for diseases of the blood and blood-forming organs and certain disorders involving the immune mechanism: Secondary | ICD-10-CM

## 2015-10-12 DIAGNOSIS — Z1231 Encounter for screening mammogram for malignant neoplasm of breast: Secondary | ICD-10-CM | POA: Diagnosis not present

## 2015-10-12 DIAGNOSIS — Z566 Other physical and mental strain related to work: Secondary | ICD-10-CM

## 2015-10-12 LAB — CBC WITH DIFFERENTIAL/PLATELET
Basophils Absolute: 0 cells/uL (ref 0–200)
Basophils Relative: 0 %
Eosinophils Absolute: 68 cells/uL (ref 15–500)
Eosinophils Relative: 1 %
HCT: 43.5 % (ref 35.0–45.0)
Hemoglobin: 14.8 g/dL (ref 11.7–15.5)
Lymphocytes Relative: 25 %
Lymphs Abs: 1700 cells/uL (ref 850–3900)
MCH: 29.1 pg (ref 27.0–33.0)
MCHC: 34 g/dL (ref 32.0–36.0)
MCV: 85.6 fL (ref 80.0–100.0)
MPV: 9.3 fL (ref 7.5–12.5)
Monocytes Absolute: 408 cells/uL (ref 200–950)
Monocytes Relative: 6 %
Neutro Abs: 4624 cells/uL (ref 1500–7800)
Neutrophils Relative %: 68 %
Platelets: 255 10*3/uL (ref 140–400)
RBC: 5.08 MIL/uL (ref 3.80–5.10)
RDW: 14 % (ref 11.0–15.0)
WBC: 6.8 10*3/uL (ref 3.8–10.8)

## 2015-10-12 LAB — LIPID PANEL
Cholesterol: 215 mg/dL — ABNORMAL HIGH (ref 125–200)
HDL: 131 mg/dL (ref >=46)
LDL Cholesterol: 70 mg/dL (ref <130)
Total CHOL/HDL Ratio: 1.6 Ratio (ref <=5.0)
Triglycerides: 70 mg/dL (ref <150)
VLDL: 14 mg/dL (ref <30)

## 2015-10-12 LAB — COMPLETE METABOLIC PANEL WITH GFR
ALT: 11 U/L (ref 6–29)
AST: 17 U/L (ref 10–35)
Albumin: 4.3 g/dL (ref 3.6–5.1)
Alkaline Phosphatase: 81 U/L (ref 33–130)
BUN: 12 mg/dL (ref 7–25)
CO2: 28 mmol/L (ref 20–31)
Calcium: 9.4 mg/dL (ref 8.6–10.4)
Chloride: 104 mmol/L (ref 98–110)
Creat: 0.9 mg/dL (ref 0.50–1.05)
GFR, Est African American: 82 mL/min (ref >=60)
GFR, Est Non African American: 71 mL/min (ref >=60)
Glucose, Bld: 89 mg/dL (ref 65–99)
Potassium: 4 mmol/L (ref 3.5–5.3)
Sodium: 139 mmol/L (ref 135–146)
Total Bilirubin: 0.7 mg/dL (ref 0.2–1.2)
Total Protein: 7.1 g/dL (ref 6.1–8.1)

## 2015-10-12 LAB — HEPATITIS C ANTIBODY: HCV Ab: NEGATIVE

## 2015-10-12 MED ORDER — ALPRAZOLAM 0.25 MG PO TABS: 0.2500 mg | ORAL_TABLET | Freq: Every day | ORAL | Status: DC | PRN

## 2015-10-12 MED ORDER — METHOCARBAMOL 500 MG PO TABS: 500.0000 mg | ORAL_TABLET | Freq: Three times a day (TID) | ORAL | Status: DC | PRN

## 2015-10-12 MED ORDER — NAPROXEN 500 MG PO TABS: 500.0000 mg | ORAL_TABLET | Freq: Two times a day (BID) | ORAL | Status: DC

## 2015-10-12 NOTE — Patient Instructions (Addendum)
IF you received an x-ray today, you will receive an invoice from [facility]. Please contact [facility] at [phone] with questions or concerns regarding your invoice.   IF you received labwork today, you will receive an invoice from [employer]. Please contact [facility] at [phone] with questions or concerns regarding your invoice.   Our billing staff will not be able to assist you with questions regarding bills from these companies.  You will be contacted with the lab results as soon as they are available. The fastest way to get your results is to activate your My Chart account. Instructions are located on the last page of this paperwork. If you have not heard from [place] regarding the results in 2 weeks, please contact this office.    We recommend that you schedule a mammogram for breast cancer screening. Typically, you do not need a referral to do this. Please contact a local imaging center to schedule your mammogram.  [facility] - [phone]  *ask for the Radiology [place] - [phone] or [phone]  [place] [phone] [place] [phone] [facility] - [phone]  *ask for the [facility] - [phone]  *ask for the Radiology Department [facility] - [phone]  *ask for the Mammography Department [facility] - [phone]  For therapy -- [facility] ([address] [place]) - [phone] [place]) - [phone] [place] [facility]  - [phone] (do not file insurance)    Health Maintenance, Female Adopting a healthy lifestyle and getting preventive care  can go a long way to promote health and wellness. Talk with your health care provider about what schedule of regular examinations is right for you. This is a good chance for you to check in with your provider about disease prevention and staying healthy. In between checkups, there are plenty of things you can do on your own. Experts have done a lot of research about which lifestyle changes and preventive measures are most likely to keep you healthy. Ask your health care provider for more information. WEIGHT AND DIET  Eat a healthy diet  Be sure to include plenty of vegetables, fruits, low-fat dairy products, and lean protein.  Do not eat a lot of foods high in solid fats, added sugars, or salt.  Get regular exercise. This is one of the most important things you can do for your health.  Most adults should exercise for at least 150 minutes each week. The exercise should increase your heart rate and make you sweat (moderate-intensity exercise).  Most adults should also do strengthening exercises at least twice a week. This is in addition to the moderate-intensity exercise.  Maintain a healthy weight  Body mass index (BMI) is a measurement that can be used to identify possible weight problems. It estimates body fat based on height and weight. Your health care provider can help determine your BMI and help you achieve or maintain a healthy weight.  For females 85 years of age and older:   A BMI below 18.5 is considered underweight.  A BMI of 18.5 to 24.9 is normal.  A BMI of 25 to 29.9 is considered overweight.  A BMI of 30 and above is considered obese.  Watch levels of cholesterol and  blood lipids  You should start having your blood tested for lipids and cholesterol at 58 years of age, then have this test every 5 years.  You may need to have your cholesterol levels checked more often if:  Your lipid or cholesterol levels are high.  You are older than 58 years of age.  You are at  high risk for heart disease.  CANCER SCREENING   Lung Cancer  Lung cancer screening is recommended for adults 57-40 years old who are at high risk for lung cancer because of a history of smoking.  A yearly low-dose CT scan of the lungs is recommended for people who:  Currently smoke.  Have quit within the past 15 years.  Have at least a 30-pack-year history of smoking. A pack year is smoking an average of one pack of cigarettes a day for 1 year.  Yearly screening should continue until it has been 15 years since you quit.  Yearly screening should stop if you develop a health problem that would prevent you from having lung cancer treatment.  Breast Cancer  Practice breast self-awareness. This means understanding how your breasts normally appear and feel.  It also means doing regular breast self-exams. Let your health care provider know about any changes, no matter how small.  If you are in your 20s or 30s, you should have a clinical breast exam (CBE) by a health care provider every 1-3 years as part of a regular health exam.  If you are 85 or older, have a CBE every year. Also consider having a breast X-ray (mammogram) every year.  If you have a family history of breast cancer, talk to your health care provider about genetic screening.  If you are at high risk for breast cancer, talk to your health care provider about having an MRI and a mammogram every year.  Breast cancer gene (BRCA) assessment is recommended for women who have family members with BRCA-related cancers. BRCA-related cancers include:  Breast.  Ovarian.  Tubal.  Peritoneal cancers.  Results of the assessment will determine the need for genetic counseling and BRCA1 and BRCA2 testing. Cervical Cancer Your health care provider may recommend that you be screened regularly for cancer of the pelvic organs (ovaries, uterus, and vagina). This screening involves a pelvic examination, including checking for  microscopic changes to the surface of your cervix (Pap test). You may be encouraged to have this screening done every 3 years, beginning at age 2.  For women ages 14-65, health care providers may recommend pelvic exams and Pap testing every 3 years, or they may recommend the Pap and pelvic exam, combined with testing for human papilloma virus (HPV), every 5 years. Some types of HPV increase your risk of cervical cancer. Testing for HPV may also be done on women of any age with unclear Pap test results.  Other health care providers may not recommend any screening for nonpregnant women who are considered low risk for pelvic cancer and who do not have symptoms. Ask your health care provider if a screening pelvic exam is right for you.  If you have had past treatment for cervical cancer or a condition that could lead to cancer, you need Pap tests and screening for cancer for at least 20 years after your treatment. If Pap tests have been discontinued, your risk factors (such as having a new sexual partner) need to be reassessed to determine if screening should resume. Some women have medical problems that increase the chance of getting  cervical cancer. In these cases, your health care provider may recommend more frequent screening and Pap tests. Colorectal Cancer  This type of cancer can be detected and often prevented.  Routine colorectal cancer screening usually begins at 58 years of age and continues through 58 years of age.  Your health care provider may recommend screening at an earlier age if you have risk factors for colon cancer.  Your health care provider may also recommend using home test kits to check for hidden blood in the stool.  A small camera at the end of a tube can be used to examine your colon directly (sigmoidoscopy or colonoscopy). This is done to check for the earliest forms of colorectal cancer.  Routine screening usually begins at age 74.  Direct examination of the colon  should be repeated every 5-10 years through 58 years of age. However, you may need to be screened more often if early forms of precancerous polyps or small growths are found. Skin Cancer  Check your skin from head to toe regularly.  Tell your health care provider about any new moles or changes in moles, especially if there is a change in a mole's shape or color.  Also tell your health care provider if you have a mole that is larger than the size of a pencil eraser.  Always use sunscreen. Apply sunscreen liberally and repeatedly throughout the day.  Protect yourself by wearing long sleeves, pants, a wide-brimmed hat, and sunglasses whenever you are outside. HEART DISEASE, DIABETES, AND HIGH BLOOD PRESSURE   High blood pressure causes heart disease and increases the risk of stroke. High blood pressure is more likely to develop in:  People who have blood pressure in the high end of the normal range (130-139/85-89 mm Hg).  People who are overweight or obese.  People who are African American.  If you are 41-25 years of age, have your blood pressure checked every 3-5 years. If you are 76 years of age or older, have your blood pressure checked every year. You should have your blood pressure measured twice--once when you are at a hospital or clinic, and once when you are not at a hospital or clinic. Record the average of the two measurements. To check your blood pressure when you are not at a hospital or clinic, you can use:  An automated blood pressure machine at a pharmacy.  A home blood pressure monitor.  If you are between 56 years and 59 years old, ask your health care provider if you should take aspirin to prevent strokes.  Have regular diabetes screenings. This involves taking a blood sample to check your fasting blood sugar level.  If you are at a normal weight and have a low risk for diabetes, have this test once every three years after 58 years of age.  If you are overweight and  have a high risk for diabetes, consider being tested at a younger age or more often. PREVENTING INFECTION  Hepatitis B  If you have a higher risk for hepatitis B, you should be screened for this virus. You are considered at high risk for hepatitis B if:  You were born in a country where hepatitis B is common. Ask your health care provider which countries are considered high risk.  Your parents were born in a high-risk country, and you have not been immunized against hepatitis B (hepatitis B vaccine).  You have HIV or AIDS.  You use needles to inject street drugs.  You live with  someone who has hepatitis B.  You have had sex with someone who has hepatitis B.  You get hemodialysis treatment.  You take certain medicines for conditions, including cancer, organ transplantation, and autoimmune conditions. Hepatitis C  Blood testing is recommended for:  Everyone born from [birth year] through 1963.  Anyone with known risk factors for hepatitis C. Sexually transmitted infections (STIs)  You should be screened for sexually transmitted infections (STIs) including gonorrhea and chlamydia if:  You are sexually active and are younger than 58 years of age.  You are older than 58 years of age and your health care provider tells you that you are at risk for this type of infection.  Your sexual activity has changed since you were last screened and you are at an increased risk for chlamydia or gonorrhea. Ask your health care provider if you are at risk.  If you do not have HIV, but are at risk, it may be recommended that you take a prescription medicine daily to prevent HIV infection. This is called pre-exposure prophylaxis (PrEP). You are considered at risk if:  You are sexually active and do not regularly use condoms or know the HIV status of your partner(s).  You take drugs by injection.  You are sexually active with a partner who has HIV. Talk with your health care provider about whether you  are at high risk of being infected with HIV. If you choose to begin PrEP, you should first be tested for HIV. You should then be tested every 3 months for as long as you are taking PrEP.  PREGNANCY   If you are premenopausal and you may become pregnant, ask your health care provider about preconception counseling.  If you may become pregnant, take 400 to 800 micrograms (mcg) of folic acid every day.  If you want to prevent pregnancy, talk to your health care provider about birth control (contraception). OSTEOPOROSIS AND MENOPAUSE   Osteoporosis is a disease in which the bones lose minerals and strength with aging. This can result in serious bone fractures. Your risk for osteoporosis can be identified using a bone density scan.  If you are 8 years of age or older, or if you are at risk for osteoporosis and fractures, ask your health care provider if you should be screened.  Ask your health care provider whether you should take a calcium or vitamin D supplement to lower your risk for osteoporosis.  Menopause may have certain physical symptoms and risks.  Hormone replacement therapy may reduce some of these symptoms and risks. Talk to your health care provider about whether hormone replacement therapy is right for you.  HOME CARE INSTRUCTIONS   Schedule regular health, dental, and eye exams.  Stay current with your immunizations.   Do not use any tobacco products including cigarettes, chewing tobacco, or electronic cigarettes.  If you are pregnant, do not drink alcohol.  If you are breastfeeding, limit how much and how often you drink alcohol.  Limit alcohol intake to no more than 1 drink per day for nonpregnant women. One drink equals 12 ounces of beer, 5 ounces of wine, or 1 ounces of hard liquor.  Do not use street drugs.  Do not share needles.  Ask your health care provider for help if you need support or information about quitting drugs.  Tell your health care provider if  you often feel depressed.  Tell your health care provider if you have ever been abused or do not feel safe at home.  This information is not intended to replace advice given to you by your health care provider. Make sure you discuss any questions you have with your health care provider.   Document Released: 11/08/2010 Document Revised: 05/16/2014 Document Reviewed: 03/27/2013 [employer]vier Interactive Patient Education [employer].

## 2015-10-12 NOTE — Progress Notes (Signed)
Carrie BeltsKerry Beard  MRN: [id number] DOB: 1955/05/30  Subjective:  Pt presents to clinic for a CPE.  She has a form that needs to be filled out for her insurance which she has to do every year.   Last dental exam: not seen regular - does not remember when she had last dental exam - does not get cavities so does not go Last vision exam: many years - wears reading glasses Last pap smear:  11/2012 - normal -  Last mammogram: more than 5 years - no family history of breast cancer - does random SBE Last colonoscopy: last year - normal - in 5 years - removed a couple small polyps Vaccinations      Tetanus - 11/08/2012  She would like a refill of a muscle relaxer that she uses only when she gets low back muscle spasms related to working with her horse - she has uses Flexeril in the past but it causes grogginess for about 2-3 days which she cannot tolerate - she would like to know if there is something else that she could use that might do the same thing.  She has had increase stress at work - she works for a company that does work for [employer] with [employer] and she is worried with the current political issues and the [employer] that she may not have a job in the future - the entire group that she works with is stressed out - her anxiety is making her short tempered at times (about 2 times a week) - she has been on xanax in the past and it helped her - she does not want to use it daily because she knows it is addictive but she feels like she needs something to help when she gets completely overwhelmed - she has tried therapy in the past and it helped but her insurance does not cover it well and she is worried that it will make her look less healthy - she is sleeping ok - she has not increased her use of ETOH as a result of this stress -   Patient Active Problem List   Diagnosis Date Noted  . ELEVATED BP READING WITHOUT DX HYPERTENSION 02/03/2010  . UNSPECIFIED HYPOTHYROIDISM 01/06/2010    Current Outpatient  Prescriptions on File Prior to Visit  Medication Sig Dispense Refill  . cyclobenzaprine (FLEXERIL) 10 MG tablet Take 10 mg by mouth 3 (three) times daily as needed for muscle spasms.     No current facility-administered medications on file prior to visit.    No Known Allergies  Social History   Social History  . Marital Status: Single    Spouse Name: N/A  . Number of Children: 0  . Years of Education: N/A   Occupational History  . biology technician    Social History Main Topics  . Smoking status: Former Smoker    Quit date: 05/09/1998  . Smokeless tobacco: None  . Alcohol Use: 0.0 oz/week    0 Standard drinks or equivalent per week     Comment: social rare  . Drug Use: No  . Sexual Activity: Not Currently    Birth Control/ Protection: Abstinence   Other Topics Concern  . None   Social History Narrative   Single   No children   Rides horses    Past Surgical History  Procedure Laterality Date  . Hernia repair      Family History  Problem Relation Age of Onset  . Hypertension Mother   .  Diabetes Father   . Cancer Father   . Heart disease Father   . Hyperlipidemia Father     Review of Systems  Constitutional: Negative.   HENT: Negative.   Eyes: Negative.   Respiratory: Negative.   Cardiovascular: Negative.   Gastrointestinal: Negative.   Endocrine: Negative.   Genitourinary: Negative.   Musculoskeletal: Negative.   Skin: Negative.   Allergic/Immunologic: Negative.   Neurological: Negative.   Hematological: Negative.   Psychiatric/Behavioral: Negative.    Objective:  BP 140/82 mmHg  Pulse 98  Temp(Src) 98.1 F (36.7 C) (Oral)  Resp 18  Ht  (1.549 m)  Wt 130 lb (58.968 kg)  BMI 24.58 kg/m2  SpO2 98%  Physical Exam  Constitutional: She is oriented to person, place, and time and well-developed, well-nourished, and in no distress.  HENT:  Head: Normocephalic and atraumatic.  Right Ear: Hearing, tympanic membrane, external ear and ear  canal normal.  Left Ear: Hearing, tympanic membrane, external ear and ear canal normal.  Nose: Nose normal.  Mouth/Throat: Uvula is midline, oropharynx is clear and moist and mucous membranes are normal.  Eyes: Conjunctivae and EOM are normal. Pupils are equal, round, and reactive to light.  Neck: Trachea normal and normal range of motion. Neck supple. No thyroid mass and no thyromegaly present.  Cardiovascular: Normal rate, regular rhythm and normal heart sounds.   No murmur heard. Pulmonary/Chest: Effort normal and breath sounds normal. She has no wheezes. Right breast exhibits no inverted nipple, no mass, no nipple discharge, no skin change and no tenderness. Left breast exhibits no inverted nipple, no mass, no nipple discharge, no skin change and no tenderness. Breasts are symmetrical.  Abdominal: Soft. Bowel sounds are normal. There is no tenderness.  Musculoskeletal: Normal range of motion.  Lymphadenopathy:    She has no cervical adenopathy.  Neurological: She is alert and oriented to person, place, and time. She has normal motor skills, normal sensation, normal strength and normal reflexes. Gait normal.  Skin: Skin is warm and dry.  Psychiatric: Mood, memory, affect and judgment normal.    Visual Acuity Screening   Right eye Left eye Both eyes  Without correction:  With correction:       Assessment and Plan :  Annual physical exam - anticipatory guidance given - form filled out she was given the form and she will attach a copy of her labs to that form  Screening for metabolic disorder - Plan: COMPLETE METABOLIC PANEL WITH GFR  Screening, lipid - Plan: Lipid panel  Screening for deficiency anemia - Plan: CBC with Differential/Platelet  Need for hepatitis C screening test - Plan: Hepatitis C antibody  Visit for screening mammogram - Plan: MM Digital Screening  Muscle spasm - Plan: methocarbamol (ROBAXIN) 500 MG tablet, naproxen (NAPROSYN) 500 MG tablet -  for when she has muscle spasms - she will use as needed -   Stress at work - Plan: ALPRAZolam (XANAX) 0.25 MG tablet - we talked about short term use with minimal weekly use and she is in agreement with the plan -   Benny Lennert PA-C  [facility] 10/12/2015 9:51 AM

## 2016-10-14 ENCOUNTER — Ambulatory Visit (INDEPENDENT_AMBULATORY_CARE_PROVIDER_SITE_OTHER): Payer: 59 | Admitting: Physician Assistant

## 2016-10-14 ENCOUNTER — Encounter: Payer: Self-pay | Admitting: Physician Assistant

## 2016-10-14 VITALS — BP 156/81 | HR 56 | Temp 98.4°F | Resp 17 | Ht 61.5 in | Wt 132.0 lb

## 2016-10-14 DIAGNOSIS — E7800 Pure hypercholesterolemia, unspecified: Secondary | ICD-10-CM

## 2016-10-14 DIAGNOSIS — M62838 Other muscle spasm: Secondary | ICD-10-CM | POA: Diagnosis not present

## 2016-10-14 DIAGNOSIS — I10 Essential (primary) hypertension: Secondary | ICD-10-CM | POA: Diagnosis not present

## 2016-10-14 DIAGNOSIS — L989 Disorder of the skin and subcutaneous tissue, unspecified: Secondary | ICD-10-CM

## 2016-10-14 DIAGNOSIS — Z0000 Encounter for general adult medical examination without abnormal findings: Secondary | ICD-10-CM

## 2016-10-14 DIAGNOSIS — Z13228 Encounter for screening for other metabolic disorders: Secondary | ICD-10-CM

## 2016-10-14 DIAGNOSIS — Z130 Encounter for screening for diseases of the blood and blood-forming organs and certain disorders involving the immune mechanism: Secondary | ICD-10-CM

## 2016-10-14 DIAGNOSIS — Z1231 Encounter for screening mammogram for malignant neoplasm of breast: Secondary | ICD-10-CM

## 2016-10-14 DIAGNOSIS — Z01419 Encounter for gynecological examination (general) (routine) without abnormal findings: Secondary | ICD-10-CM

## 2016-10-14 MED ORDER — CHLORTHALIDONE 25 MG PO TABS: 25.0000 mg | ORAL_TABLET | Freq: Every day | ORAL | 1 refills | Status: DC

## 2016-10-14 MED ORDER — METAXALONE 800 MG PO TABS: 400.0000 mg | ORAL_TABLET | Freq: Three times a day (TID) | ORAL | 0 refills | Status: DC

## 2016-10-14 NOTE — Patient Instructions (Signed)
     IF you received an x-ray today, you will receive an invoice from [facility]. Please contact [facility] at [phone] with questions or concerns regarding your invoice.   IF you received labwork today, you will receive an invoice from [facility]. Please contact [facility] at [phone] with questions or concerns regarding your invoice.   Our billing staff will not be able to assist you with questions regarding bills from these companies.  You will be contacted with the lab results as soon as they are available. The fastest way to get your results is to activate your My Chart account. Instructions are located on the last page of this paperwork. If you have not heard from us regarding the results in 2 weeks, please contact this office.     

## 2016-10-14 NOTE — Progress Notes (Signed)
Carrie Beard  MRN: [id number] DOB: 1955-05-11  PCP: Mancel Bale, PA-C  Subjective:  Pt presents to clinic for a CPE.  She is doing well.  She is worried about her BP - it seems to be running high and she has noticed that she is having some ringing in her ears that she thinks might be related.  She has a new boss at work and her stress level is much reduced as a result.  She still has random muscle spasms that occur likely due to her horseback riding - the flexeril made her to sleepy and the robaxin did not work - she would like to try something different to try.  Last dental exam: not in a while Last vision exam: reading glasses Last pap: 2014 Last mammo: long time Last colonoscopy: 2016 - 3 years repeat Vaccinations- UTD  Typical meals for patient: 3 meals, - snacks - fruit Typical beverage choices: water Exercises: 3 times per week for 1 hour Sleeps: sleeping well 9 hrs per night  Patient Active Problem List   Diagnosis Date Noted  . ELEVATED BP READING WITHOUT DX HYPERTENSION 02/03/2010  . UNSPECIFIED HYPOTHYROIDISM 01/06/2010    Review of Systems  Constitutional: Negative.  Negative for chills and fever.  HENT: Positive for tinnitus (intermittent ).   Eyes: Negative.   Respiratory: Negative.  Negative for shortness of breath.   Cardiovascular: Negative.  Negative for chest pain, palpitations and leg swelling.  Gastrointestinal: Negative.   Endocrine: Negative.   Genitourinary: Negative.   Musculoskeletal: Negative.   Skin: Negative.   Allergic/Immunologic: Negative.   Neurological: Negative.  Negative for headaches.  Hematological: Negative.   Psychiatric/Behavioral: Negative.      No current outpatient prescriptions on file prior to visit.   No current facility-administered medications on file prior to visit.     No Known Allergies  Social History   Social History  . Marital status: Single    Spouse name: N/A  . Number of children: 0  . Years of  education: N/A   Occupational History  . biology technician    Social History Main Topics  . Smoking status: Former Smoker    Quit date: 05/09/1998  . Smokeless tobacco: Never Used  . Alcohol use 0.0 oz/week     Comment: social rare  . Drug use: No  . Sexual activity: Not Currently    Birth control/ protection: Abstinence   Other Topics Concern  . None   Social History Narrative   Single   No children   Rides horses    Past Surgical History:  Procedure Laterality Date  . HERNIA REPAIR      Family History  Problem Relation Age of Onset  . Hypertension Mother   . Hypercholesterolemia Mother   . Diabetes Father   . Cancer Father        skin, prostate  . Heart disease Father   . Hyperlipidemia Father      Objective:  BP (!) 156/81   Pulse (!) 56   Temp 98.4 F (36.9 C) (Oral)   Resp 17   Ht 5' 1.5" (1.562 m)   Wt 132 lb (59.9 kg)   SpO2 98%   BMI 24.54 kg/m   Physical Exam  Constitutional: She is oriented to person, place, and time and well-developed, well-nourished, and in no distress.  HENT:  Head: Normocephalic and atraumatic.    Right Ear: Hearing, tympanic membrane, external ear and ear canal normal.  Left Ear: Hearing,  tympanic membrane, external ear and ear canal normal.  Nose: Nose normal.  Mouth/Throat: Uvula is midline, oropharynx is clear and moist and mucous membranes are normal.  Eyes: Conjunctivae and EOM are normal. Pupils are equal, round, and reactive to light.  Neck: Trachea normal and normal range of motion. Neck supple. No thyroid mass and no thyromegaly present.  Cardiovascular: Normal rate, regular rhythm and normal heart sounds.   No murmur heard. Pulmonary/Chest: Effort normal and breath sounds normal. She has no wheezes. Right breast exhibits no inverted nipple, no mass, no nipple discharge, no skin change and no tenderness. Left breast exhibits no inverted nipple, no mass, no nipple discharge, no skin change and no tenderness.  Breasts are symmetrical.  Abdominal: Soft. Bowel sounds are normal. There is no tenderness.  Genitourinary: Vagina normal, uterus normal, cervix normal, right adnexa normal, left adnexa normal and vulva normal.  Genitourinary Comments: Introitus is tight  Musculoskeletal: Normal range of motion.  Lymphadenopathy:    She has no cervical adenopathy.  Neurological: She is alert and oriented to person, place, and time. She has normal motor skills, normal sensation, normal strength and normal reflexes. Gait normal.  Skin: Skin is warm and dry.  Psychiatric: Mood, memory, affect and judgment normal.    Wt Readings from Last 3 Encounters:  10/14/16 132 lb (59.9 kg)  10/12/15 130 lb (59 kg)  10/29/14 122 lb (55.3 kg)     Visual Acuity Screening   Right eye Left eye Both eyes  Without correction: 20/20 20/20 20/20   With correction:       Assessment and Plan :  Annual physical exam  Muscle spasm - Plan: metaxalone (SKELAXIN) 800 MG tablet - change medication to see if the tolerability improved  Elevated cholesterol - Plan: Lipid panel  Screening for metabolic disorder - Plan: CMP14+EGFR  Screening for deficiency anemia - Plan: CBC with Differential/Platelet  Encounter for gynecological examination without abnormal finding - Plan: Pap IG and HPV (high risk) DNA detection  Essential hypertension - Plan: chlorthalidone (HYGROTON) 25 MG tablet - start medication - pt to try and monitor at home - recheck in 1 month  Face lesion - Plan: Ambulatory referral to Dermatology  Visit for screening mammogram - Plan: MM Digital Screening  Windell Hummingbird PA-C  [facility] 10/17/2016 8:10 AM

## 2016-10-15 LAB — CBC WITH DIFFERENTIAL/PLATELET
Basophils Absolute: 0 10*3/uL (ref 0.0–0.2)
Basos: 0 %
EOS (ABSOLUTE): 0.1 10*3/uL (ref 0.0–0.4)
Eos: 1 %
Hematocrit: 44.2 % (ref 34.0–46.6)
Hemoglobin: 14.3 g/dL (ref 11.1–15.9)
Immature Grans (Abs): 0 10*3/uL (ref 0.0–0.1)
Immature Granulocytes: 0 %
Lymphocytes Absolute: 1.8 10*3/uL (ref 0.7–3.1)
Lymphs: 26 %
MCH: 28.8 pg (ref 26.6–33.0)
MCHC: 32.4 g/dL (ref 31.5–35.7)
MCV: 89 fL (ref 79–97)
Monocytes Absolute: 0.4 10*3/uL (ref 0.1–0.9)
Monocytes: 6 %
Neutrophils Absolute: 4.5 10*3/uL (ref 1.4–7.0)
Neutrophils: 67 %
Platelets: 268 10*3/uL (ref 150–379)
RBC: 4.97 x10E6/uL (ref 3.77–5.28)
RDW: 14.3 % (ref 12.3–15.4)
WBC: 6.9 10*3/uL (ref 3.4–10.8)

## 2016-10-15 LAB — CMP14+EGFR
ALT: 13 IU/L (ref 0–32)
AST: 20 IU/L (ref 0–40)
Albumin/Globulin Ratio: 2.3 — ABNORMAL HIGH (ref 1.2–2.2)
Albumin: 5.1 g/dL (ref 3.5–5.5)
Alkaline Phosphatase: 82 IU/L (ref 39–117)
BUN/Creatinine Ratio: 17 (ref 9–23)
BUN: 17 mg/dL (ref 6–24)
Bilirubin Total: 0.5 mg/dL (ref 0.0–1.2)
CO2: 26 mmol/L (ref 18–29)
Calcium: 9.6 mg/dL (ref 8.7–10.2)
Chloride: 101 mmol/L (ref 96–106)
Creatinine, Ser: 1.03 mg/dL — ABNORMAL HIGH (ref 0.57–1.00)
GFR calc Af Amer: 69 mL/min/{1.73_m2} (ref >59)
GFR calc non Af Amer: 60 mL/min/{1.73_m2} (ref >59)
Globulin, Total: 2.2 g/dL (ref 1.5–4.5)
Glucose: 89 mg/dL (ref 65–99)
Potassium: 4.8 mmol/L (ref 3.5–5.2)
Sodium: 140 mmol/L (ref 134–144)
Total Protein: 7.3 g/dL (ref 6.0–8.5)

## 2016-10-15 LAB — LIPID PANEL
Chol/HDL Ratio: 2.1 ratio (ref 0.0–4.4)
Cholesterol, Total: 220 mg/dL — ABNORMAL HIGH (ref 100–199)
HDL: 107 mg/dL (ref >39)
LDL Calculated: 98 mg/dL (ref 0–99)
Triglycerides: 74 mg/dL (ref 0–149)
VLDL Cholesterol Cal: 15 mg/dL (ref 5–40)

## 2016-10-17 ENCOUNTER — Telehealth: Payer: Self-pay

## 2016-10-18 LAB — PAP IG AND HPV HIGH-RISK
HPV, high-risk: NEGATIVE
PAP Smear Comment: 0

## 2016-11-08 ENCOUNTER — Telehealth: Payer: Self-pay | Admitting: Family Medicine

## 2016-11-08 NOTE — Telephone Encounter (Signed)
Left message on VM stating that rx was refilled on 10/14/2016 with one refill and that she should be good until the end of the month. Advised that she call back with any questions.

## 2016-11-08 NOTE — Telephone Encounter (Signed)
Carrie Beard pt need a refill on BP medicine she states that the only time she can come into office is the 20th and you want be here that day

## 2016-11-11 ENCOUNTER — Other Ambulatory Visit: Payer: Self-pay | Admitting: Emergency Medicine

## 2016-11-11 DIAGNOSIS — I10 Essential (primary) hypertension: Secondary | ICD-10-CM

## 2016-11-11 MED ORDER — CHLORTHALIDONE 25 MG PO TABS: 25.0000 mg | ORAL_TABLET | Freq: Every day | ORAL | 1 refills | Status: DC

## 2016-11-25 ENCOUNTER — Ambulatory Visit
Admission: RE | Admit: 2016-11-25 | Discharge: 2016-11-25 | Disposition: A | Discharge status: Home / Self Care | Payer: 59 | Source: Ambulatory Visit | Attending: Physician Assistant | Admitting: Physician Assistant

## 2016-11-25 DIAGNOSIS — Z1231 Encounter for screening mammogram for malignant neoplasm of breast: Secondary | ICD-10-CM

## 2017-01-29 ENCOUNTER — Emergency Department
Admission: EM | Admit: 2017-01-29 | Discharge: 2017-01-29 | Disposition: A | Discharge status: Home / Self Care | Payer: 59 | Attending: Emergency Medicine | Admitting: Emergency Medicine

## 2017-01-29 ENCOUNTER — Encounter: Payer: Self-pay | Admitting: Emergency Medicine

## 2017-01-29 DIAGNOSIS — Z87891 Personal history of nicotine dependence: Secondary | ICD-10-CM | POA: Insufficient documentation

## 2017-01-29 DIAGNOSIS — Z79899 Other long term (current) drug therapy: Secondary | ICD-10-CM | POA: Diagnosis not present

## 2017-01-29 DIAGNOSIS — R21 Rash and other nonspecific skin eruption: Secondary | ICD-10-CM | POA: Diagnosis present

## 2017-01-29 DIAGNOSIS — T7840XA Allergy, unspecified, initial encounter: Secondary | ICD-10-CM | POA: Insufficient documentation

## 2017-01-29 MED ORDER — FAMOTIDINE 20 MG PO TABS
20.0000 mg | ORAL_TABLET | Freq: Once | ORAL | Status: AC
  Administered 2017-01-29: 20 mg via ORAL
  Filled 2017-01-29: qty 1

## 2017-01-29 MED ORDER — METHYLPREDNISOLONE SODIUM SUCC 125 MG IJ SOLR
125.0000 mg | Freq: Once | INTRAMUSCULAR | Status: AC
  Administered 2017-01-29: 125 mg via INTRAMUSCULAR
  Filled 2017-01-29: qty 2

## 2017-01-29 MED ORDER — RANITIDINE HCL 150 MG PO TABS: 150.0000 mg | ORAL_TABLET | Freq: Two times a day (BID) | ORAL | 0 refills | Status: DC

## 2017-01-29 MED ORDER — PREDNISONE 10 MG (21) PO TBPK: ORAL_TABLET | ORAL | 0 refills | Status: DC

## 2017-01-29 NOTE — ED Provider Notes (Signed)
[facility] Emergency Department Provider Note  ____________________________________________  Time seen: Approximately 11:07 AM  I have reviewed the triage vital signs and the nursing notes.   HISTORY  Chief Complaint Rash    HPI Carrie Beard is a 61 y.o. female that presents to the emergency department for evaluation of a rash for 1 day. Patient was outside working on brush yesterday when she broke out in hives of her chest and back. Rash is extremely itchy. She states she is allergic to fire ants but did not see any ants. She took 50 mg of Benadryl last night, which did not help. No fever, throat swelling, shortness breath, chest pain, nausea, vomiting, abdominal pain.   Past Medical History:  Diagnosis Date  . Allergy   . Thyroid disease     Patient Active Problem List   Diagnosis Date Noted  . ELEVATED BP READING WITHOUT DX HYPERTENSION 02/03/2010  . UNSPECIFIED HYPOTHYROIDISM 01/06/2010    Past Surgical History:  Procedure Laterality Date  . HERNIA REPAIR      Prior to Admission medications   Medication Sig Start Date End Date Taking? Authorizing Provider  chlorthalidone (HYGROTON) 25 MG tablet Take 1 tablet (25 mg total) by mouth daily. 11/11/16   Weber, Dema Severin, PA-C  metaxalone (SKELAXIN) 800 MG tablet Take 0.5-1 tablets (400-800 mg total) by mouth 3 (three) times daily. 10/14/16   Weber, Dema Severin, PA-C  predniSONE (STERAPRED UNI-PAK 21 TAB) 10 MG (21) TBPK tablet Take 6 tablets on day 1, take 5 tablets on day 2, take 4 tablets on day 3, take 3 tablets on day 4, take 2 tablets on day 5, take 1 tablet on day 6 01/29/17   Enid Derry, PA-C  ranitidine (ZANTAC) 150 MG tablet Take 1 tablet (150 mg total) by mouth 2 (two) times daily. 01/29/17 01/29/18  Enid Derry, PA-C    Allergies Tramadol  Family History  Problem Relation Age of Onset  . Hypertension Mother   . Hypercholesterolemia Mother   . Diabetes Father   . Cancer Father         skin, prostate  . Heart disease Father   . Hyperlipidemia Father     Social History Social History  Substance Use Topics  . Smoking status: Former Smoker    Quit date: 05/09/1998  . Smokeless tobacco: Never Used  . Alcohol use 0.0 oz/week     Comment: social rare     Review of Systems  Constitutional: No fever/chills Cardiovascular: No chest pain. Respiratory: No SOB. Gastrointestinal: No abdominal pain.  No nausea, no vomiting.  Musculoskeletal: Negative for musculoskeletal pain. Skin: Negative for abrasions, lacerations, ecchymosis. Positive for rash. Neurological: Negative for headaches, numbness or tingling   ____________________________________________   PHYSICAL EXAM:  VITAL SIGNS: ED Triage Vitals  Enc Vitals Group     BP 01/29/17 1027 (!) 155/97     Pulse Rate 01/29/17 1027 60     Resp 01/29/17 1027 18     Temp 01/29/17 1027 98.2 F (36.8 C)     Temp Source 01/29/17 1027 Oral     SpO2 01/29/17 1027 99 %     Weight 01/29/17 1026 125 lb (56.7 kg)     Height 01/29/17 1026  (1.549 m)     Head Circumference --      Peak Flow --      Pain Score --      Pain Loc --      Pain Edu? --  Excl. in GC? --      Constitutional: Alert and oriented. Well appearing and in no acute distress. Eyes: Conjunctivae are normal. PERRL. EOMI. Head: Atraumatic. ENT:      Ears:      Nose: No congestion/rhinnorhea.      Mouth/Throat: Mucous membranes are moist.  Neck: No stridor.  Cardiovascular: Normal rate, regular rhythm.  Good peripheral circulation. Respiratory: Normal respiratory effort without tachypnea or retractions. Lungs CTAB. Good air entry to the bases with no decreased or absent breath sounds. Musculoskeletal: Full range of motion to all extremities. No gross deformities appreciated. Neurologic:  Normal speech and language. No gross focal neurologic deficits are appreciated.  Skin:  Skin is warm, dry and intact. Wheals to the upper back and  chest.   ____________________________________________   LABS (all labs ordered are listed, but only abnormal results are displayed)  Labs Reviewed - No data to display ____________________________________________  EKG   ____________________________________________  RADIOLOGY   No results found.  ____________________________________________    PROCEDURES  Procedure(s) performed:    Procedures    Medications  methylPREDNISolone sodium succinate (SOLU-MEDROL) 125 mg/2 mL injection 125 mg (125 mg Intramuscular Given 01/29/17 1119)  famotidine (PEPCID) tablet 20 mg (20 mg Oral Given 01/29/17 1118)     ____________________________________________   INITIAL IMPRESSION / ASSESSMENT AND PLAN / ED COURSE  Pertinent labs & imaging results that were available during my care of the patient were reviewed by me and considered in my medical decision making (see chart for details).  Review of the [place] CSRS was performed in accordance of the [facility] prior to dispensing any controlled drugs.    Patient's diagnosis is consistent with allergic reaction. Vital signs and exam are reassuring. Patient was given IM for a Medrol and oral Pepcid in ED. She does not want to take Benadryl because she does not want to be tired the rest of the day. She states that she will take Benadryl later today once she gets home from work done. Patient will be discharged home with prescriptions for prednisone, ranitidine, triamcinolone. Patient is to follow up with PCP as directed. Patient is given ED precautions to return to the ED for any worsening or new symptoms.     ____________________________________________  FINAL CLINICAL IMPRESSION(S) / ED DIAGNOSES  Final diagnoses:  Allergic reaction, initial encounter      NEW MEDICATIONS STARTED DURING THIS VISIT:  Discharge Medication List as of 01/29/2017 11:46 AM    START taking these medications   Details  predniSONE (STERAPRED UNI-PAK 21 TAB)  10 MG (21) TBPK tablet Take 6 tablets on day 1, take 5 tablets on day 2, take 4 tablets on day 3, take 3 tablets on day 4, take 2 tablets on day 5, take 1 tablet on day 6, Print    ranitidine (ZANTAC) 150 MG tablet Take 1 tablet (150 mg total) by mouth 2 (two) times daily., Starting Sun 01/29/2017, Until Mon 01/29/2018, Print            This chart was dictated using voice recognition software/Dragon. Despite best efforts to proofread, errors can occur which can change the meaning. Any change was purely unintentional.    Enid Derry, PA-C 01/29/17 1215    Jeanmarie Plant, MD 01/29/17 1425

## 2017-01-29 NOTE — ED Triage Notes (Signed)
Pt presents to ED via POV, states was bit by something yesterday, developed hives to her back, took  benadryl PO with relief, this morning states hives returned to back, pt noted to have red rash to her upper and lower back. Pt denies SHOB, CP, tongue swelling, pt states she just feels "acutely itchy".

## 2017-01-29 NOTE — ED Notes (Addendum)
See triage note  States she was bitten by something yesterday  Developed hives    Then itchy rash today  On arrival rash and hives noted to back  No resp distress noted

## 2017-03-04 NOTE — Telephone Encounter (Signed)
error 

## 2017-06-04 ENCOUNTER — Other Ambulatory Visit: Payer: Self-pay | Admitting: Physician Assistant

## 2017-06-04 DIAGNOSIS — I10 Essential (primary) hypertension: Secondary | ICD-10-CM

## 2017-08-03 ENCOUNTER — Other Ambulatory Visit: Payer: Self-pay | Admitting: Physician Assistant

## 2017-08-03 DIAGNOSIS — I10 Essential (primary) hypertension: Secondary | ICD-10-CM

## 2017-08-03 NOTE — Telephone Encounter (Signed)
Hygroton refill request  LOV 6/ 8/18 with Benny LennertSarah Weber  [id number] - [address], [place] [address] [place]

## 2019-07-16 ENCOUNTER — Encounter: Payer: Self-pay | Admitting: Internal Medicine

## 2019-08-20 ENCOUNTER — Ambulatory Visit: Payer: BC Managed Care – PPO | Admitting: Internal Medicine

## 2019-08-20 ENCOUNTER — Encounter: Payer: Self-pay | Admitting: Internal Medicine

## 2019-08-20 ENCOUNTER — Other Ambulatory Visit: Payer: Self-pay

## 2019-08-20 VITALS — BP 164/88 | HR 58 | Temp 97.1°F | Ht 60.75 in | Wt 108.0 lb

## 2019-08-20 DIAGNOSIS — F419 Anxiety disorder, unspecified: Secondary | ICD-10-CM | POA: Insufficient documentation

## 2019-08-20 DIAGNOSIS — I10 Essential (primary) hypertension: Secondary | ICD-10-CM | POA: Diagnosis not present

## 2019-08-20 DIAGNOSIS — F32A Depression, unspecified: Secondary | ICD-10-CM

## 2019-08-20 DIAGNOSIS — M62838 Other muscle spasm: Secondary | ICD-10-CM | POA: Diagnosis not present

## 2019-08-20 DIAGNOSIS — E0590 Thyrotoxicosis, unspecified without thyrotoxic crisis or storm: Secondary | ICD-10-CM | POA: Insufficient documentation

## 2019-08-20 DIAGNOSIS — M62830 Muscle spasm of back: Secondary | ICD-10-CM

## 2019-08-20 DIAGNOSIS — F329 Major depressive disorder, single episode, unspecified: Secondary | ICD-10-CM

## 2019-08-20 MED ORDER — METAXALONE 800 MG PO TABS: 400.0000 mg | ORAL_TABLET | Freq: Three times a day (TID) | ORAL | 2 refills | Status: DC | PRN

## 2019-08-20 MED ORDER — DESVENLAFAXINE SUCCINATE ER 50 MG PO TB24: 50.0000 mg | ORAL_TABLET | Freq: Every day | ORAL | 1 refills | Status: DC

## 2019-08-20 MED ORDER — CHLORTHALIDONE 25 MG PO TABS: 25.0000 mg | ORAL_TABLET | Freq: Every day | ORAL | 0 refills | Status: DC

## 2019-08-20 NOTE — Assessment & Plan Note (Signed)
Skelaxin refilled today

## 2019-08-20 NOTE — Patient Instructions (Signed)

## 2019-08-20 NOTE — Assessment & Plan Note (Signed)
Chlorthalidone refilled today Will check Bp and BMET in 1 month at annual exam

## 2019-08-20 NOTE — Assessment & Plan Note (Signed)
Support offered today Will trial Pristiq Referral to psychology placed for therapy

## 2019-08-20 NOTE — Assessment & Plan Note (Signed)
Resolved without intervention Will monitor TSH yearly

## 2019-08-20 NOTE — Progress Notes (Signed)
HPI  Pt presents to the clinic today to establish care and for management of the conditions listed below. She has not had a PCP in many years.  HTN: Her BP today is 164/88. She is not taking Chlorthalidone as prescribed because she ran out and was not able to follow up with the provider that prescribed it. ECG from 10/2014 reviewed.  Chronic Muscle Spasms: Mainly in her back. She takes [facility] as needed with good relief of symptoms.  Hyperthyroidism: Resolved during menopause. She does not follow with endocrinologist.  Anxiety and Depression: She is currently seeing EAP. She would like a referral for a therapist. She is interested in trying Pristiq. She has never been treated for anxiety and depression. She has had some SI, no active plan. She denies HI.  Flu: 03/2019 Tetanus: 11/2012 Zostovax: never Shingrix: never Covid: 07/2019, 08/2019 Pap Smear: 10/2016 Mammogram: 11/2016 Colon Screening: 07/2014 Vision Screening: as needed Dentist: as needed  Past Medical History:  Diagnosis Date  . Allergy   . Thyroid disease     Current Outpatient Medications  Medication Sig Dispense Refill  . chlorthalidone (HYGROTON) 25 MG tablet Take 1 tablet (25 mg total) by mouth daily. No more refills without office visit 90 tablet 0  . metaxalone (SKELAXIN) 800 MG tablet Take 0.5-1 tablets (400-800 mg total) by mouth 3 (three) times daily. 30 tablet 0  . predniSONE (STERAPRED UNI-PAK 21 TAB) 10 MG (21) TBPK tablet Take 6 tablets on day 1, take 5 tablets on day 2, take 4 tablets on day 3, take 3 tablets on day 4, take 2 tablets on day 5, take 1 tablet on day 6 21 tablet 0  . ranitidine (ZANTAC) 150 MG tablet Take 1 tablet (150 mg total) by mouth 2 (two) times daily. 14 tablet 0   No current facility-administered medications for this visit.    Allergies  Allergen Reactions  . Tramadol Nausea And Vomiting    Family History  Problem Relation Age of Onset  . Hypertension Mother   .  Hypercholesterolemia Mother   . Diabetes Father   . Cancer Father        skin, prostate  . Heart disease Father   . Hyperlipidemia Father     Social History   Socioeconomic History  . Marital status: Single    Spouse name: Not on file  . Number of children: 0  . Years of education: Not on file  . Highest education level: Not on file  Occupational History  . Occupation: Geographical information systems officer  Tobacco Use  . Smoking status: Former Smoker    Quit date: 05/09/1998    Years since quitting: 21.2  . Smokeless tobacco: Never Used  Substance and Sexual Activity  . Alcohol use: Yes    Alcohol/week: 0.0 standard drinks    Comment: social rare  . Drug use: No  . Sexual activity: Not Currently    Birth control/protection: Abstinence  Other Topics Concern  . Not on file  Social History Narrative   Single   No children   Rides horses   Social Determinants of Health   Financial Resource Strain:   . Difficulty of Paying Living Expenses:   Food Insecurity:   . Worried About Charity fundraiser in the Last Year:   . Arboriculturist in the Last Year:   Transportation Needs:   . Film/video editor (Medical):   Marland Kitchen Lack of Transportation (Non-Medical):   Physical Activity:   . Days of  Exercise per Week:   . Minutes of Exercise per Session:   Stress:   . Feeling of Stress :   Social Connections:   . Frequency of Communication with Friends and Family:   . Frequency of Social Gatherings with Friends and Family:   . Attends Religious Services:   . Active Member of Clubs or Organizations:   . Attends Banker Meetings:   Marland Kitchen Marital Status:   Intimate Partner Violence:   . Fear of Current or Ex-Partner:   . Emotionally Abused:   Marland Kitchen Physically Abused:   . Sexually Abused:     ROS:  Constitutional: Denies fever, malaise, fatigue, headache or abrupt weight changes.  HEENT: Pt reports intermittent nose bleeds. Denies eye pain, eye redness, ear pain, ringing in the ears,  wax buildup, runny nose, nasal congestion, or sore throat. Respiratory: Denies difficulty breathing, shortness of breath, cough or sputum production.   Cardiovascular: Denies chest pain, chest tightness, palpitations or swelling in the hands or feet.  Gastrointestinal: Denies abdominal pain, bloating, constipation, diarrhea or blood in the stool.  GU: Denies frequency, urgency, pain with urination, blood in urine, odor or discharge. Musculoskeletal: Pt reports muscle spasms in back. Denies decrease in range of motion, difficulty with gait,  or joint pain and swelling.  Skin: Denies redness, rashes, lesions or ulcercations.  Neurological: Pt reports insomnia. Denies dizziness, difficulty with memory, difficulty with speech or problems with balance and coordination.  Psych: Pt reports anxiety and depression. Denies HI.  No other specific complaints in a complete review of systems (except as listed in HPI above).  PE:  BP (!) 164/88   Pulse (!) 58   Temp (!) 97.1 F (36.2 C) (Temporal)   Ht 5' 0.75" (1.543 m)   Wt 108 lb (49 kg)   SpO2 98%   BMI 20.57 kg/m   Wt Readings from Last 3 Encounters:  01/29/17 125 lb (56.7 kg)  10/14/16 132 lb (59.9 kg)  10/12/15 130 lb (59 kg)    General: Appears her stated age, well developed, well nourished in NAD. Skin: Dry and intact. HEENT: Head: normal shape and size; Eyes: sclera white, no icterus, conjunctiva pink, PERRLA and EOMs intact;  Cardiovascular: Normal rate and rhythm. S1,S2 noted.  No murmur, rubs or gallops noted.  Pulmonary/Chest: Normal effort and positive vesicular breath sounds. No respiratory distress. No wheezes, rales or ronchi noted.  Musculoskeletal: No difficulty with gait.  Neurological: Alert and oriented.  Psychiatric: Mildly anxious appearing.  BMET    Component Value Date/Time   NA 140 10/14/2016 0844   K 4.8 10/14/2016 0844   CL 101 10/14/2016 0844   CO2 26 10/14/2016 0844   GLUCOSE 89 10/14/2016 0844    GLUCOSE 89 10/12/2015 0946   BUN 17 10/14/2016 0844   CREATININE 1.03 (H) 10/14/2016 0844   CREATININE 0.90 10/12/2015 0946   CALCIUM 9.6 10/14/2016 0844   GFRNONAA 60 10/14/2016 0844   GFRNONAA 71 10/12/2015 0946   GFRAA 69 10/14/2016 0844   GFRAA 82 10/12/2015 0946    Lipid Panel     Component Value Date/Time   CHOL 220 (H) 10/14/2016 0844   TRIG 74 10/14/2016 0844   HDL 107 10/14/2016 0844   CHOLHDL 2.1 10/14/2016 0844   CHOLHDL 1.6 10/12/2015 0946   VLDL 14 10/12/2015 0946   LDLCALC 98 10/14/2016 0844    CBC    Component Value Date/Time   WBC 6.9 10/14/2016 0844   WBC 6.8 10/12/2015 0946  RBC 4.97 10/14/2016 0844   RBC 5.08 10/12/2015 0946   HGB 14.3 10/14/2016 0844   HCT 44.2 10/14/2016 0844   PLT 268 10/14/2016 0844   MCV 89 10/14/2016 0844   MCH 28.8 10/14/2016 0844   MCH 29.1 10/12/2015 0946   MCHC 32.4 10/14/2016 0844   MCHC 34.0 10/12/2015 0946   RDW 14.3 10/14/2016 0844   LYMPHSABS 1.8 10/14/2016 0844   MONOABS 408 10/12/2015 0946   EOSABS 0.1 10/14/2016 0844   BASOSABS 0.0 10/14/2016 0844    Hgb A1C Lab Results  Component Value Date   HGBA1C 5.2 11/17/2012     Assessment and Plan:   Nicki Reaper, NP This visit occurred during the SARS-CoV-2 public health emergency.  Safety protocols were in place, including screening questions prior to the visit, additional usage of staff PPE, and extensive cleaning of exam room while observing appropriate contact time as indicated for disinfecting solutions.

## 2019-09-11 ENCOUNTER — Other Ambulatory Visit: Payer: Self-pay | Admitting: Internal Medicine

## 2019-09-11 DIAGNOSIS — I10 Essential (primary) hypertension: Secondary | ICD-10-CM

## 2019-09-13 NOTE — Telephone Encounter (Signed)
Last filled 08/20/2019 at est care appt... please advise if okay to fill 90 day supply, pt has CPE appt next week

## 2019-09-17 ENCOUNTER — Ambulatory Visit: Payer: BC Managed Care – PPO | Admitting: Psychology

## 2019-09-19 ENCOUNTER — Other Ambulatory Visit: Payer: Self-pay

## 2019-09-19 ENCOUNTER — Ambulatory Visit (INDEPENDENT_AMBULATORY_CARE_PROVIDER_SITE_OTHER): Payer: BC Managed Care – PPO | Admitting: Internal Medicine

## 2019-09-19 ENCOUNTER — Encounter: Payer: Self-pay | Admitting: Internal Medicine

## 2019-09-19 VITALS — BP 150/80 | HR 75 | Temp 97.5°F | Ht 60.75 in | Wt 105.0 lb

## 2019-09-19 DIAGNOSIS — F329 Major depressive disorder, single episode, unspecified: Secondary | ICD-10-CM

## 2019-09-19 DIAGNOSIS — I10 Essential (primary) hypertension: Secondary | ICD-10-CM | POA: Diagnosis not present

## 2019-09-19 DIAGNOSIS — F32A Depression, unspecified: Secondary | ICD-10-CM

## 2019-09-19 DIAGNOSIS — F419 Anxiety disorder, unspecified: Secondary | ICD-10-CM

## 2019-09-19 DIAGNOSIS — Z0000 Encounter for general adult medical examination without abnormal findings: Secondary | ICD-10-CM | POA: Diagnosis not present

## 2019-09-19 MED ORDER — LOSARTAN POTASSIUM 25 MG PO TABS: 25.0000 mg | ORAL_TABLET | Freq: Every day | ORAL | 0 refills | Status: DC

## 2019-09-19 NOTE — Patient Instructions (Signed)

## 2019-09-19 NOTE — Assessment & Plan Note (Signed)
Uncontrolled D/C Chlorthalidone RX for Losartan 25 mg daily

## 2019-09-19 NOTE — Assessment & Plan Note (Signed)
Improved on Desvenlafaxine- will continue

## 2019-09-19 NOTE — Progress Notes (Signed)
Subjective:    Patient ID: Carrie Beard, female    DOB: 12-31-1955, 64 y.o.   MRN: [id number]  HPI  Patient presents the clinic today for her annual exam.  HTN: Her BP today is 150/80. She ran out of her Chlorthalidone.  ECG from 10/2014 reviewed.  Mood Disorder: Improving on Desvenlafaxine. She is happy with the dose. She is not seeing a therapist. She denies SI/HI.  Flu: 03/2019 Tetanus: 11/2012 Zostavax: never Shingrix: never Covid: 07/2019, 08/2019 Pap smear: 10/2016 Mammogram: 11/2016 Bone Density: never Colon Screening: 07/2014, 5 years- scheduled next week Vision Screen: as needed Dentist: as needed  Diet: She does eat meat. She consumes some fruits, more veggies. She tries to avoid fried foods. She drinks mostly water.  Exercise: Body weigh exercise, riding horses  Review of Systems      Past Medical History:  Diagnosis Date  . Allergy   . Thyroid disease     Current Outpatient Medications  Medication Sig Dispense Refill  . desvenlafaxine (PRISTIQ) 50 MG 24 hr tablet TAKE 1 TABLET BY MOUTH EVERY DAY 90 tablet 1  . chlorthalidone (HYGROTON) 25 MG tablet Take 1 tablet (25 mg total) by mouth daily. No more refills without office visit 30 tablet 0  . metaxalone (SKELAXIN) 800 MG tablet Take 0.5-1 tablets (400-800 mg total) by mouth 3 (three) times daily as needed for muscle spasms. 30 tablet 2  . Multiple Vitamin (MULTIVITAMIN) tablet Take 1 tablet by mouth daily.    Marland Kitchen Raspberry Ketones 100 MG CAPS Take by mouth.     No current facility-administered medications for this visit.    Allergies  Allergen Reactions  . Tramadol Nausea And Vomiting    Family History  Problem Relation Age of Onset  . Hypertension Mother   . Hypercholesterolemia Mother   . Diabetes Father   . Cancer Father        skin, prostate  . Heart disease Father   . Hyperlipidemia Father   . Kidney disease Father   . Diabetes Paternal Grandfather     Social History   Socioeconomic  History  . Marital status: Single    Spouse name: Not on file  . Number of children: 0  . Years of education: Not on file  . Highest education level: Not on file  Occupational History  . Occupation: Geographical information systems officer  Tobacco Use  . Smoking status: Former Smoker    Quit date: 05/09/1998    Years since quitting: 21.3  . Smokeless tobacco: Never Used  Substance and Sexual Activity  . Alcohol use: Yes    Alcohol/week: 0.0 standard drinks    Comment: social rare  . Drug use: No  . Sexual activity: Not Currently    Birth control/protection: Abstinence  Other Topics Concern  . Not on file  Social History Narrative   Single   No children   Rides horses   Social Determinants of Health   Financial Resource Strain:   . Difficulty of Paying Living Expenses:   Food Insecurity:   . Worried About Charity fundraiser in the Last Year:   . Arboriculturist in the Last Year:   Transportation Needs:   . Film/video editor (Medical):   Marland Kitchen Lack of Transportation (Non-Medical):   Physical Activity:   . Days of Exercise per Week:   . Minutes of Exercise per Session:   Stress:   . Feeling of Stress :   Social Connections:   . Frequency  of Communication with Friends and Family:   . Frequency of Social Gatherings with Friends and Family:   . Attends Religious Services:   . Active Member of Clubs or Organizations:   . Attends Banker Meetings:   Marland Kitchen Marital Status:   Intimate Partner Violence:   . Fear of Current or Ex-Partner:   . Emotionally Abused:   Marland Kitchen Physically Abused:   . Sexually Abused:      Constitutional: Denies fever, malaise, fatigue, headache or abrupt weight changes.  HEENT: Denies eye pain, eye redness, ear pain, ringing in the ears, wax buildup, runny nose, nasal congestion, bloody nose, or sore throat. Respiratory: Denies difficulty breathing, shortness of breath, cough or sputum production.   Cardiovascular: Denies chest pain, chest tightness,  palpitations or swelling in the hands or feet.  Gastrointestinal: Denies abdominal pain, bloating, constipation, diarrhea or blood in the stool.  GU: Denies urgency, frequency, pain with urination, burning sensation, blood in urine, odor or discharge. Musculoskeletal: Denies decrease in range of motion, difficulty with gait, muscle pain or joint pain and swelling.  Skin: Denies redness, rashes, lesions or ulcercations.  Neurological: Denies dizziness, difficulty with memory, difficulty with speech or problems with balance and coordination.  Psych: Pt has a history of anxiety and depression. Denies SI/HI.  No other specific complaints in a complete review of systems (except as listed in HPI above).  Objective:   Physical Exam  BP (!) 150/80 (BP Location: Left Arm, Patient Position: Sitting, Cuff Size: Normal)   Pulse 75   Temp (!) 97.5 F (36.4 C) (Oral)   Ht 5' 0.75" (1.543 m)   Wt 105 lb (47.6 kg)   SpO2 95%   BMI 20.00 kg/m   Wt Readings from Last 3 Encounters:  08/20/19 108 lb (49 kg)  01/29/17 125 lb (56.7 kg)  10/14/16 132 lb (59.9 kg)    General: Appears her stated age, well developed, well nourished in NAD. Skin: Warm, dry and intact. No rashes noted. HEENT: Head: normal shape and size; Eyes: sclera white, no icterus, conjunctiva pink, PERRLA and EOMs intact;  Neck:  Neck supple, trachea midline. No masses, lumps or thyromegaly present.  Cardiovascular: Normal rate and rhythm. S1,S2 noted.  No murmur, rubs or gallops noted. No JVD or BLE edema. No carotid bruits noted. Pulmonary/Chest: Normal effort and positive vesicular breath sounds. No respiratory distress. No wheezes, rales or ronchi noted.  Abdomen: Soft and nontender. Normal bowel sounds. No distention or masses noted. Liver, spleen and kidneys non palpable. Musculoskeletal: Strenght 5/5 BEU/BLE. No difficulty with gait.  Neurological: Alert and oriented. Cranial nerves II-XII grossly intact. Coordination normal.    Psychiatric: Mood and affect normal. Behavior is normal. Judgment and thought content normal.     BMET    Component Value Date/Time   NA 140 10/14/2016 0844   K 4.8 10/14/2016 0844   CL 101 10/14/2016 0844   CO2 26 10/14/2016 0844   GLUCOSE 89 10/14/2016 0844   GLUCOSE 89 10/12/2015 0946   BUN 17 10/14/2016 0844   CREATININE 1.03 (H) 10/14/2016 0844   CREATININE 0.90 10/12/2015 0946   CALCIUM 9.6 10/14/2016 0844   GFRNONAA 60 10/14/2016 0844   GFRNONAA 71 10/12/2015 0946   GFRAA 69 10/14/2016 0844   GFRAA 82 10/12/2015 0946    Lipid Panel     Component Value Date/Time   CHOL 220 (H) 10/14/2016 0844   TRIG 74 10/14/2016 0844   HDL 107 10/14/2016 0844   CHOLHDL 2.1  10/14/2016 0844   CHOLHDL 1.6 10/12/2015 0946   VLDL 14 10/12/2015 0946   LDLCALC 98 10/14/2016 0844    CBC    Component Value Date/Time   WBC 6.9 10/14/2016 0844   WBC 6.8 10/12/2015 0946   RBC 4.97 10/14/2016 0844   RBC 5.08 10/12/2015 0946   HGB 14.3 10/14/2016 0844   HCT 44.2 10/14/2016 0844   PLT 268 10/14/2016 0844   MCV 89 10/14/2016 0844   MCH 28.8 10/14/2016 0844   MCH 29.1 10/12/2015 0946   MCHC 32.4 10/14/2016 0844   MCHC 34.0 10/12/2015 0946   RDW 14.3 10/14/2016 0844   LYMPHSABS 1.8 10/14/2016 0844   MONOABS 408 10/12/2015 0946   EOSABS 0.1 10/14/2016 0844   BASOSABS 0.0 10/14/2016 0844    Hgb A1C Lab Results  Component Value Date   HGBA1C 5.2 11/17/2012           Assessment & Plan:   Preventative Health Maintenance:  Encouraged her to get a flu shot in the fall Tetanus UTD She will call her insurance to check Shingrix coverage Covid UTD Pap smear UTD She will schedule her mammogram through work She will check coverage on a bone density exam She has an appt for her colonoscopy Encouraged her to consume a balanced diet and exercise regimen Advised her to see an eye doctor and dentist annually Will check CBC, CMET, Lipid and Vit D today  RTC in 2 weeks for  follow up HTN   Nicki Reaper, NP This visit occurred during the SARS-CoV-2 public health emergency.  Safety protocols were in place, including screening questions prior to the visit, additional usage of staff PPE, and extensive cleaning of exam room while observing appropriate contact time as indicated for disinfecting solutions.

## 2019-09-20 LAB — LIPID PANEL
Cholesterol: 231 mg/dL — ABNORMAL HIGH (ref 0–200)
HDL: 107.1 mg/dL (ref >39.00)
LDL Cholesterol: 110 mg/dL — ABNORMAL HIGH (ref 0–99)
NonHDL: 124.11
Total CHOL/HDL Ratio: 2
Triglycerides: 70 mg/dL (ref 0.0–149.0)
VLDL: 14 mg/dL (ref 0.0–40.0)

## 2019-09-20 LAB — COMPREHENSIVE METABOLIC PANEL
ALT: 19 U/L (ref 0–35)
AST: 23 U/L (ref 0–37)
Albumin: 4.5 g/dL (ref 3.5–5.2)
Alkaline Phosphatase: 63 U/L (ref 39–117)
BUN: 24 mg/dL — ABNORMAL HIGH (ref 6–23)
CO2: 33 mEq/L — ABNORMAL HIGH (ref 19–32)
Calcium: 9.3 mg/dL (ref 8.4–10.5)
Chloride: 96 mEq/L (ref 96–112)
Creatinine, Ser: 1.1 mg/dL (ref 0.40–1.20)
GFR: 50.36 mL/min — ABNORMAL LOW (ref >60.00)
Glucose, Bld: 98 mg/dL (ref 70–99)
Potassium: 3.2 mEq/L — ABNORMAL LOW (ref 3.5–5.1)
Sodium: 137 mEq/L (ref 135–145)
Total Bilirubin: 0.4 mg/dL (ref 0.2–1.2)
Total Protein: 7.3 g/dL (ref 6.0–8.3)

## 2019-09-20 LAB — VITAMIN D 25 HYDROXY (VIT D DEFICIENCY, FRACTURES): VITD: 36.96 ng/mL (ref 30.00–100.00)

## 2019-09-20 LAB — CBC
HCT: 41.4 % (ref 36.0–46.0)
Hemoglobin: 14.1 g/dL (ref 12.0–15.0)
MCHC: 34 g/dL (ref 30.0–36.0)
MCV: 88.2 fl (ref 78.0–100.0)
Platelets: 269 10*3/uL (ref 150.0–400.0)
RBC: 4.69 Mil/uL (ref 3.87–5.11)
RDW: 13.2 % (ref 11.5–15.5)
WBC: 9.1 10*3/uL (ref 4.0–10.5)

## 2019-09-26 ENCOUNTER — Encounter: Payer: Self-pay | Admitting: Internal Medicine

## 2019-09-26 DIAGNOSIS — R194 Change in bowel habit: Secondary | ICD-10-CM | POA: Diagnosis not present

## 2019-09-26 DIAGNOSIS — Z1211 Encounter for screening for malignant neoplasm of colon: Secondary | ICD-10-CM | POA: Diagnosis not present

## 2019-10-02 ENCOUNTER — Ambulatory Visit: Payer: BC Managed Care – PPO | Admitting: Internal Medicine

## 2019-10-02 ENCOUNTER — Other Ambulatory Visit: Payer: Self-pay

## 2019-10-02 VITALS — BP 142/84 | HR 65 | Temp 97.2°F | Wt 107.0 lb

## 2019-10-02 DIAGNOSIS — F5101 Primary insomnia: Secondary | ICD-10-CM

## 2019-10-02 DIAGNOSIS — N289 Disorder of kidney and ureter, unspecified: Secondary | ICD-10-CM

## 2019-10-02 DIAGNOSIS — I10 Essential (primary) hypertension: Secondary | ICD-10-CM | POA: Diagnosis not present

## 2019-10-02 DIAGNOSIS — R252 Cramp and spasm: Secondary | ICD-10-CM

## 2019-10-02 DIAGNOSIS — E876 Hypokalemia: Secondary | ICD-10-CM

## 2019-10-02 MED ORDER — LOSARTAN POTASSIUM 50 MG PO TABS: 50.0000 mg | ORAL_TABLET | Freq: Every day | ORAL | 0 refills | Status: DC

## 2019-10-03 ENCOUNTER — Encounter: Payer: Self-pay | Admitting: Internal Medicine

## 2019-10-03 DIAGNOSIS — G4700 Insomnia, unspecified: Secondary | ICD-10-CM | POA: Insufficient documentation

## 2019-10-03 LAB — BASIC METABOLIC PANEL
BUN: 19 mg/dL (ref 6–23)
CO2: 30 mEq/L (ref 19–32)
Calcium: 9.4 mg/dL (ref 8.4–10.5)
Chloride: 105 mEq/L (ref 96–112)
Creatinine, Ser: 0.9 mg/dL (ref 0.40–1.20)
GFR: 63.48 mL/min (ref >60.00)
Glucose, Bld: 106 mg/dL — ABNORMAL HIGH (ref 70–99)
Potassium: 4.2 mEq/L (ref 3.5–5.1)
Sodium: 140 mEq/L (ref 135–145)

## 2019-10-03 NOTE — Assessment & Plan Note (Signed)
We will try Melatonin 5 mg p.o. nightly

## 2019-10-03 NOTE — Progress Notes (Signed)
Subjective:    Patient ID: Carrie Beard, female    DOB: 1955-10-31, 64 y.o.   MRN: [id number]  HPI  Patient presents to the clinic today for follow-up of hypertension.  At her last visit her Chlorthalidone was DC'd and she was started on Losartan 25 mg daily she has been taking the medication as prescribed and denies adverse side effects.  Her BP to 142/84.  ECG from 10/2014 reviewed.  Her labs recently indicated that she had slightly decreased kidney function and low potassium.  Her chlorthalidone was DC'd.  She does report some intermittent muscle cramping.  She has been trying to increase her water intake.  She also reports difficulty staying asleep.  She has no trouble falling asleep.  She has not tried any medications OTC for this.  Review of Systems      Past Medical History:  Diagnosis Date  . Allergy   . Thyroid disease     Current Outpatient Medications  Medication Sig Dispense Refill  . desvenlafaxine (PRISTIQ) 50 MG 24 hr tablet TAKE 1 TABLET BY MOUTH EVERY DAY 90 tablet 1  . metaxalone (SKELAXIN) 800 MG tablet Take 0.5-1 tablets (400-800 mg total) by mouth 3 (three) times daily as needed for muscle spasms. 30 tablet 2  . Multiple Vitamin (MULTIVITAMIN) tablet Take 1 tablet by mouth daily.    Marland Kitchen Raspberry Ketones 100 MG CAPS Take by mouth.    . losartan (COZAAR) 50 MG tablet Take 1 tablet (50 mg total) by mouth daily. 30 tablet 0   No current facility-administered medications for this visit.    Allergies  Allergen Reactions  . Tramadol Nausea And Vomiting    Family History  Problem Relation Age of Onset  . Hypertension Mother   . Hypercholesterolemia Mother   . Diabetes Father   . Cancer Father        skin, prostate  . Heart disease Father   . Hyperlipidemia Father   . Kidney disease Father   . Diabetes Paternal Grandfather     Social History   Socioeconomic History  . Marital status: Single    Spouse name: Not on file  . Number of children: 0  .  Years of education: Not on file  . Highest education level: Not on file  Occupational History  . Occupation: Insurance claims handler  Tobacco Use  . Smoking status: Former Smoker    Quit date: 05/09/1998    Years since quitting: 21.4  . Smokeless tobacco: Never Used  Substance and Sexual Activity  . Alcohol use: Yes    Alcohol/week: 0.0 standard drinks    Comment: social rare  . Drug use: No  . Sexual activity: Not Currently    Birth control/protection: Abstinence  Other Topics Concern  . Not on file  Social History Narrative   Single   No children   Rides horses   Social Determinants of Health   Financial Resource Strain:   . Difficulty of Paying Living Expenses:   Food Insecurity:   . Worried About Programme researcher, broadcasting/film/video in the Last Year:   . Barista in the Last Year:   Transportation Needs:   . Freight forwarder (Medical):   Marland Kitchen Lack of Transportation (Non-Medical):   Physical Activity:   . Days of Exercise per Week:   . Minutes of Exercise per Session:   Stress:   . Feeling of Stress :   Social Connections:   . Frequency of Communication with Friends and  Family:   . Frequency of Social Gatherings with Friends and Family:   . Attends Religious Services:   . Active Member of Clubs or Organizations:   . Attends Banker Meetings:   Marland Kitchen Marital Status:   Intimate Partner Violence:   . Fear of Current or Ex-Partner:   . Emotionally Abused:   Marland Kitchen Physically Abused:   . Sexually Abused:      Constitutional: Denies fever, malaise, fatigue, headache or abrupt weight changes.  Respiratory: Denies difficulty breathing, shortness of breath, cough or sputum production.   Cardiovascular: Denies chest pain, chest tightness, palpitations or swelling in the hands or feet.  Musculoskeletal: Patient reports intermittent muscle cramps.  Denies decrease in range of motion, difficulty with gait, or joint pain and swelling.  Neurological: Patient reports insomnia.   Denies dizziness, difficulty with memory, difficulty with speech or problems with balance and coordination.  Psych: Denies anxiety, depression, SI/HI.  No other specific complaints in a complete review of systems (except as listed in HPI above).  Objective:   Physical Exam  BP (!) 142/84   Pulse 65   Temp (!) 97.2 F (36.2 C) (Temporal)   Wt 107 lb (48.5 kg)   SpO2 98%   BMI 20.38 kg/m  Wt Readings from Last 3 Encounters:  10/02/19 107 lb (48.5 kg)  09/19/19 105 lb (47.6 kg)  08/20/19 108 lb (49 kg)    General: Appears her stated age, well developed, well nourished in NAD. Cardiovascular: Normal rate and rhythm. S1,S2 noted.  No murmur, rubs or gallops noted. No JVD or BLE edema.  Pulmonary/Chest: Normal effort and positive vesicular breath sounds. No respiratory distress. No wheezes, rales or ronchi noted.  Musculoskeletal: No difficulty with gait.  Neurological: Alert and oriented.   BMET    Component Value Date/Time   NA 137 09/19/2019 1538   NA 140 10/14/2016 0844   K 3.2 (L) 09/19/2019 1538   CL 96 09/19/2019 1538   CO2 33 (H) 09/19/2019 1538   GLUCOSE 98 09/19/2019 1538   BUN 24 (H) 09/19/2019 1538   BUN 17 10/14/2016 0844   CREATININE 1.10 09/19/2019 1538   CREATININE 0.90 10/12/2015 0946   CALCIUM 9.3 09/19/2019 1538   GFRNONAA 60 10/14/2016 0844   GFRNONAA 71 10/12/2015 0946   GFRAA 69 10/14/2016 0844   GFRAA 82 10/12/2015 0946    Lipid Panel     Component Value Date/Time   CHOL 231 (H) 09/19/2019 1538   CHOL 220 (H) 10/14/2016 0844   TRIG 70.0 09/19/2019 1538   HDL 107.10 09/19/2019 1538   HDL 107 10/14/2016 0844   CHOLHDL 2 09/19/2019 1538   VLDL 14.0 09/19/2019 1538   LDLCALC 110 (H) 09/19/2019 1538   LDLCALC 98 10/14/2016 0844    CBC    Component Value Date/Time   WBC 9.1 09/19/2019 1538   RBC 4.69 09/19/2019 1538   HGB 14.1 09/19/2019 1538   HGB 14.3 10/14/2016 0844   HCT 41.4 09/19/2019 1538   HCT 44.2 10/14/2016 0844   PLT 269.0  09/19/2019 1538   PLT 268 10/14/2016 0844   MCV 88.2 09/19/2019 1538   MCV 89 10/14/2016 0844   MCH 28.8 10/14/2016 0844   MCH 29.1 10/12/2015 0946   MCHC 34.0 09/19/2019 1538   RDW 13.2 09/19/2019 1538   RDW 14.3 10/14/2016 0844   LYMPHSABS 1.8 10/14/2016 0844   MONOABS 408 10/12/2015 0946   EOSABS 0.1 10/14/2016 0844   BASOSABS 0.0 10/14/2016 0844  Hgb A1C Lab Results  Component Value Date   HGBA1C 5.2 11/17/2012            Assessment & Plan:   Decreased Kidney Function, Hypokalemia, Muscle Cramping:  We will repeat BMET today  RTC in 2 weeks for nurse visit for blood pressure check Webb Silversmith, NP This visit occurred during the SARS-CoV-2 public health emergency.  Safety protocols were in place, including screening questions prior to the visit, additional usage of staff PPE, and extensive cleaning of exam room while observing appropriate contact time as indicated for disinfecting solutions.

## 2019-10-03 NOTE — Patient Instructions (Signed)

## 2019-10-03 NOTE — Assessment & Plan Note (Signed)
Improved but not at goal Increase Losartan to 50 mg daily

## 2019-10-11 ENCOUNTER — Other Ambulatory Visit: Payer: Self-pay | Admitting: Internal Medicine

## 2019-10-11 DIAGNOSIS — Z0000 Encounter for general adult medical examination without abnormal findings: Secondary | ICD-10-CM

## 2019-10-11 DIAGNOSIS — I10 Essential (primary) hypertension: Secondary | ICD-10-CM

## 2019-10-14 ENCOUNTER — Encounter: Payer: Self-pay | Admitting: Internal Medicine

## 2019-10-14 DIAGNOSIS — R194 Change in bowel habit: Secondary | ICD-10-CM | POA: Diagnosis not present

## 2019-10-14 DIAGNOSIS — Z1211 Encounter for screening for malignant neoplasm of colon: Secondary | ICD-10-CM | POA: Diagnosis not present

## 2019-10-14 DIAGNOSIS — D122 Benign neoplasm of ascending colon: Secondary | ICD-10-CM | POA: Diagnosis not present

## 2019-10-14 DIAGNOSIS — K635 Polyp of colon: Secondary | ICD-10-CM | POA: Diagnosis not present

## 2019-10-15 ENCOUNTER — Telehealth: Payer: Self-pay | Admitting: Internal Medicine

## 2019-10-15 NOTE — Telephone Encounter (Signed)
Wait until Thursday please, we will check then. Not uncommon to have elevated blood pressure during a dreaded procedure.

## 2019-10-15 NOTE — Telephone Encounter (Signed)
Left detailed message to keep her appt as scheduled. Call us with any urgent concerns in the meantime.

## 2019-10-15 NOTE — Telephone Encounter (Signed)
Patient called today in regards to her nurse visit on Thursday for BP check She stated she had her colonoscopy done yesterday and her BP was high. Patient did not remember what the BP readings were She stated it was "174 over something. And never got below 152 over something"   Patient is currently taking Losartan and wanted to know if you wanted to change her medication and come back at a later time to have BP checked. Or if you would still like her to keep the appointment Thursday to have it checked

## 2019-10-17 ENCOUNTER — Other Ambulatory Visit: Payer: Self-pay

## 2019-10-17 ENCOUNTER — Ambulatory Visit: Payer: BC Managed Care – PPO | Admitting: *Deleted

## 2019-10-17 VITALS — BP 130/80 | HR 81

## 2019-10-17 DIAGNOSIS — I10 Essential (primary) hypertension: Secondary | ICD-10-CM

## 2019-10-18 NOTE — Progress Notes (Signed)
Per Cecile Sheerer encounter order on 10/02/2019, patient presents today on 10/17/2019 for a nurse visit blood pressure check for ongoing follow up and management.  Vital Sign Readings today BP 130/80 P 81.

## 2019-10-26 ENCOUNTER — Other Ambulatory Visit: Payer: Self-pay | Admitting: Internal Medicine

## 2019-10-26 DIAGNOSIS — I10 Essential (primary) hypertension: Secondary | ICD-10-CM

## 2019-10-30 ENCOUNTER — Telehealth: Payer: Self-pay

## 2019-10-30 NOTE — Telephone Encounter (Signed)
I called the patient and advised her to call her insurance to see why they did not cover the full visit. She verbalized understanding.

## 2019-10-30 NOTE — Telephone Encounter (Signed)
Pt said she just got off phone with [facility] billing and was advised to contact [facility]. On 09/19/19 pt thought that was annual exam and would not owe a copay. Pt request cb if she does actually owe co pay or not.

## 2019-11-09 ENCOUNTER — Other Ambulatory Visit: Payer: Self-pay | Admitting: Internal Medicine

## 2019-12-06 ENCOUNTER — Other Ambulatory Visit: Payer: Self-pay | Admitting: Internal Medicine

## 2020-01-06 ENCOUNTER — Other Ambulatory Visit: Payer: Self-pay | Admitting: Internal Medicine

## 2020-01-08 ENCOUNTER — Telehealth: Payer: Self-pay

## 2020-01-08 DIAGNOSIS — I10 Essential (primary) hypertension: Secondary | ICD-10-CM

## 2020-01-08 NOTE — Telephone Encounter (Signed)
Pt left v/m that [facility] phoned the pt and pharmacy has requested 3 month supply on her 2 meds but have not heard from [facility]. Pt is requesting 90 day supply of her 2 meds to [facility]. Pt request cb.

## 2020-01-10 MED ORDER — LOSARTAN POTASSIUM 50 MG PO TABS: 50.0000 mg | ORAL_TABLET | Freq: Every day | ORAL | 0 refills | Status: DC

## 2020-04-06 ENCOUNTER — Other Ambulatory Visit: Payer: Self-pay | Admitting: Internal Medicine

## 2020-04-06 DIAGNOSIS — I10 Essential (primary) hypertension: Secondary | ICD-10-CM

## 2020-04-11 ENCOUNTER — Other Ambulatory Visit: Payer: Self-pay | Admitting: Internal Medicine

## 2020-05-12 ENCOUNTER — Telehealth: Payer: Self-pay | Admitting: Internal Medicine

## 2020-06-10 NOTE — Telephone Encounter (Signed)
Open in error

## 2020-06-29 ENCOUNTER — Other Ambulatory Visit: Payer: Self-pay

## 2020-06-29 ENCOUNTER — Encounter: Payer: Self-pay | Admitting: Emergency Medicine

## 2020-06-29 ENCOUNTER — Emergency Department: Payer: BC Managed Care – PPO

## 2020-06-29 ENCOUNTER — Emergency Department
Admission: EM | Admit: 2020-06-29 | Discharge: 2020-06-29 | Disposition: A | Discharge status: Home / Self Care | Payer: BC Managed Care – PPO | Attending: Emergency Medicine | Admitting: Emergency Medicine

## 2020-06-29 DIAGNOSIS — W010XXA Fall on same level from slipping, tripping and stumbling without subsequent striking against object, initial encounter: Secondary | ICD-10-CM | POA: Diagnosis not present

## 2020-06-29 DIAGNOSIS — Z79899 Other long term (current) drug therapy: Secondary | ICD-10-CM | POA: Insufficient documentation

## 2020-06-29 DIAGNOSIS — S4351XA Sprain of right acromioclavicular joint, initial encounter: Secondary | ICD-10-CM | POA: Diagnosis not present

## 2020-06-29 DIAGNOSIS — I10 Essential (primary) hypertension: Secondary | ICD-10-CM | POA: Diagnosis not present

## 2020-06-29 DIAGNOSIS — S4991XA Unspecified injury of right shoulder and upper arm, initial encounter: Secondary | ICD-10-CM | POA: Diagnosis not present

## 2020-06-29 DIAGNOSIS — Y92009 Unspecified place in unspecified non-institutional (private) residence as the place of occurrence of the external cause: Secondary | ICD-10-CM | POA: Insufficient documentation

## 2020-06-29 DIAGNOSIS — Z87891 Personal history of nicotine dependence: Secondary | ICD-10-CM | POA: Insufficient documentation

## 2020-06-29 DIAGNOSIS — M25511 Pain in right shoulder: Secondary | ICD-10-CM | POA: Diagnosis not present

## 2020-06-29 DIAGNOSIS — Y9301 Activity, walking, marching and hiking: Secondary | ICD-10-CM | POA: Diagnosis not present

## 2020-06-29 MED ORDER — DOCUSATE SODIUM 100 MG PO CAPS: ORAL_CAPSULE | ORAL | 0 refills | Status: DC

## 2020-06-29 MED ORDER — OXYCODONE-ACETAMINOPHEN 5-325 MG PO TABS: 2.0000 | ORAL_TABLET | Freq: Four times a day (QID) | ORAL | 0 refills | Status: DC | PRN

## 2020-06-29 MED ORDER — KETOROLAC TROMETHAMINE 30 MG/ML IJ SOLN
30.0000 mg | Freq: Once | INTRAMUSCULAR | Status: AC
  Administered 2020-06-29: 30 mg via INTRAMUSCULAR
  Filled 2020-06-29: qty 1

## 2020-06-29 NOTE — Discharge Instructions (Signed)
You have an injury to the acromioclavicular joint in your shoulder.  It is important that you rest your shoulder using the sling and try to minimize the use of your arm.  Please read through the included information including the use of anti-inflammatory medicines, cold packs, etc.  We recommend that you call the office of Dr. Martha Clan and schedule a follow-up appointment within the next week with him or one of his orthopedic colleagues.  You may also follow-up with your primary care provider but specialist follow-up is recommended.  Use over-the-counter ibuprofen and/or Tylenol according to label instructions.  Take Percocet as prescribed for severe pain. Do not drink alcohol, drive or participate in any other potentially dangerous activities while taking this medication as it may make you sleepy. Do not take this medication with any other sedating medications, either prescription or over-the-counter. If you were prescribed Percocet or Vicodin, do not take these with acetaminophen (Tylenol) as it is already contained within these medications.   This medication is an opiate (or narcotic) pain medication and can be habit forming.  Use it as little as possible to achieve adequate pain control.  Do not use or use it with extreme caution if you have a history of opiate abuse or dependence.  If you are on a pain contract with your primary care doctor or a pain specialist, be sure to let them know you were prescribed this medication today from the [facility] Emergency Department.  This medication is intended for your use only - do not give any to anyone else and keep it in a secure place where nobody else, especially children, have access to it.  It will also cause or worsen constipation, so you may want to consider taking an over-the-counter stool softener while you are taking this medication.

## 2020-06-29 NOTE — ED Provider Notes (Signed)
[facility] Emergency Department Provider Note  ____________________________________________   Event Date/Time   First MD Initiated Contact with Patient 06/29/20 [phone]     (approximate)  I have reviewed the triage vital signs and the nursing notes.   HISTORY  Chief Complaint Shoulder Injury    HPI Carrie Beard is a 65 y.o. female with no contributory past medical history who presents by private vehicle after a fall at home.  She reports that she got up to go the bathroom during the night and it was still dark in the room.  She tripped over a laundry basket and landed on her outstretched right arm.  She has acute onset and severe pain in her right shoulder with some deformity on the top of the shoulder.  She has no other injuries.  She did not strike her head and did not lose consciousness.  She has no neck pain.   She denies nausea and vomiting.  Moving her shoulder around makes it worse.  She has no numbness nor tingling in the arm or the hand.  She is right-hand dominant.        Past Medical History:  Diagnosis Date  . Allergy   . Thyroid disease     Patient Active Problem List   Diagnosis Date Noted  . Insomnia 10/03/2019  . Hyperthyroidism 08/20/2019  . Muscle spasm of back 08/20/2019  . Anxiety and depression 08/20/2019  . HTN (hypertension) 02/03/2010    Past Surgical History:  Procedure Laterality Date  . HERNIA REPAIR      Prior to Admission medications   Medication Sig Start Date End Date Taking? Authorizing Provider  docusate sodium (COLACE) 100 MG capsule Take 1 tablet once or twice daily as needed for constipation while taking narcotic pain medicine 06/29/20  Yes Loleta Rose, MD  oxyCODONE-acetaminophen (PERCOCET) 5-325 MG tablet Take 2 tablets by mouth every 6 (six) hours as needed for severe pain. 06/29/20  Yes Loleta Rose, MD  desvenlafaxine (PRISTIQ) 50 MG 24 hr tablet TAKE ONE TABLET BY MOUTH DAILY 04/13/20   Lorre Munroe,  NP  losartan (COZAAR) 50 MG tablet TAKE 1 TABLET BY MOUTH EVERY DAY 04/07/20   Lorre Munroe, NP  metaxalone (SKELAXIN) 800 MG tablet Take 0.5-1 tablets (400-800 mg total) by mouth 3 (three) times daily as needed for muscle spasms. 08/20/19   Lorre Munroe, NP  Multiple Vitamin (MULTIVITAMIN) tablet Take 1 tablet by mouth daily.    [provider]  Raspberry Ketones 100 MG CAPS Take by mouth.    [provider]    Allergies Fire ant and Tramadol  Family History  Problem Relation Age of Onset  . Hypertension Mother   . Hypercholesterolemia Mother   . Diabetes Father   . Cancer Father        skin, prostate  . Heart disease Father   . Hyperlipidemia Father   . Kidney disease Father   . Diabetes Paternal Grandfather     Social History Social History   Tobacco Use  . Smoking status: Former Smoker    Quit date: 05/09/1998    Years since quitting: 22.1  . Smokeless tobacco: Never Used  Vaping Use  . Vaping Use: Never used  Substance Use Topics  . Alcohol use: Yes    Alcohol/week: 0.0 standard drinks    Comment: social rare  . Drug use: No    Review of Systems Constitutional: No fever/chills Cardiovascular: Denies chest pain. Respiratory: Denies shortness  of breath. Gastrointestinal: No abdominal pain.  No nausea, no vomiting.   Musculoskeletal: Pain in right shoulder after fall. Integumentary: Negative for rash. Neurological: Negative for headaches, focal weakness or numbness.   ____________________________________________   PHYSICAL EXAM:  VITAL SIGNS: ED Triage Vitals  Enc Vitals Group     BP 06/29/20 0408 (!) 161/99     Pulse Rate 06/29/20 0408 79     Resp 06/29/20 0408 18     Temp 06/29/20 0408 98.1 F (36.7 C)     Temp Source 06/29/20 0408 Oral     SpO2 06/29/20 0408 100 %     Weight 06/29/20 0408 49.9 kg (110 lb)     Height 06/29/20 0408 1.549 m (5\' 1" )     Head Circumference --      Peak Flow --      Pain Score 06/29/20 0409 7      Pain Loc --      Pain Edu? --      Excl. in GC? --     Constitutional: Alert and oriented.  Eyes: Conjunctivae are normal.  Head: Atraumatic. Mouth/Throat: Patient is wearing a mask. Neck: No stridor.  No meningeal signs.  No tenderness to palpation of the cervical spine and no pain or tenderness with flexion extension or rotation side to side of the head and neck. Cardiovascular: Normal rate, regular rhythm. Good peripheral circulation. Respiratory: Normal respiratory effort.  No retractions. Musculoskeletal: Patient has pain in her right shoulder with elevation of the distal clavicle.  The distal clavicle, AC joint, and the CC ligaments are quite tender to palpation.  There is not significant swelling of the area.  She is neurovascularly intact. Neurologic:  Normal speech and language. No gross focal neurologic deficits are appreciated.  Skin:  Skin is warm, dry and intact except for a subacute abrasion to her forearm which she said occurred several days ago and not as result of her fall tonight. Psychiatric: Mood and affect are normal. Speech and behavior are normal.  ____________________________________________   LABS (all labs ordered are listed, but only abnormal results are displayed)  Labs Reviewed - No data to display ____________________________________________  EKG  No indication for emergent EKG ____________________________________________  RADIOLOGY I, 07/01/20, personally viewed and evaluated these images (plain radiographs) as part of my medical decision making, as well as reviewing the written report by the radiologist.  ED MD interpretation: [facility] joint injury as described below  Official radiology report(s): DG Shoulder Right  Result Date: 06/29/2020 CLINICAL DATA:  Fall, right shoulder pain EXAM: RIGHT SHOULDER - 2+ VIEW COMPARISON:  None. FINDINGS: Elevation of the distal right clavicular head relative to the acromion. Background of hypertrophic  degenerative arthrosis at the acromioclavicular joint. No other acute or conspicuous osseous lesion. Humeral head is well seated within the glenoid with only mild glenohumeral arthrosis. No acute traumatic abnormality of the right chest wall. IMPRESSION: 1. Elevation of the distal right clavicular head relative to the acromion, consistent with a [place] type II/III acromioclavicular joint injury. 2. Background of moderate hypertrophic acromioclavicular and mild glenohumeral arthrosis. Electronically Signed   By: 07/01/2020 M.D.   On: 06/29/2020 04:45    ____________________________________________   PROCEDURES   Procedure(s) performed (including Critical Care):  Procedures   ____________________________________________   INITIAL IMPRESSION / MDM / ASSESSMENT AND PLAN / ED COURSE  As part of my medical decision making, I reviewed the following data within the electronic MEDICAL RECORD NUMBER Nursing notes reviewed and incorporated,  Old chart reviewed, Radiograph reviewed , Notes from prior ED visits and [place] Controlled Substance Database   Differential diagnosis includes, but is not limited to, shoulder dislocation, musculoskeletal strain, AC joint injury, clavicular fracture.  I personally reviewed the patient's imaging and agree with the radiologist's interpretation that there is an [facility] joint injury, and the radiologist estimates it is a type II versus type III.  Regardless there is no dislocation or fracture.  I talked with the patient about the nature of the injury and the importance of orthopedic follow-up, particularly in her dominant arm.  I provided a sling and talked about RICE and the use of anti-inflammatories.  Because she drove herself tonight, I cannot give her opioids, but I wrote her prescription I gave her a Toradol 30 mg intramuscular injection.  She will follow-up either with her primary care provider or with orthopedics at the next available opportunity.  I gave my usual and  customary return precautions.  ____________________________________________  FINAL CLINICAL IMPRESSION(S) / ED DIAGNOSES  Final diagnoses:  Acromioclavicular (AC) joint injury, right, initial encounter     MEDICATIONS GIVEN DURING THIS VISIT:  Medications  ketorolac (TORADOL) 30 MG/ML injection 30 mg (30 mg Intramuscular Given 06/29/20 0506)     ED Discharge Orders         Ordered    oxyCODONE-acetaminophen (PERCOCET) 5-325 MG tablet  Every 6 hours PRN        06/29/20 0505    docusate sodium (COLACE) 100 MG capsule        06/29/20 0505          *Please note:  Carrie Beard was evaluated in Emergency Department on 06/29/2020 for the symptoms described in the history of present illness. She was evaluated in the context of the global COVID-19 pandemic, which necessitated consideration that the patient might be at risk for infection with the SARS-CoV-2 virus that causes COVID-19. Institutional protocols and algorithms that pertain to the evaluation of patients at risk for COVID-19 are in a state of rapid change based on information released by regulatory bodies including the CDC and federal and state organizations. These policies and algorithms were followed during the patient's care in the ED.  Some ED evaluations and interventions may be delayed as a result of limited staffing during and after the pandemic.*  Note:  This document was prepared using Dragon voice recognition software and may include unintentional dictation errors.   Loleta Rose, MD 06/29/20 0600

## 2020-06-29 NOTE — ED Triage Notes (Addendum)
Pt reports fall from standing while walking at home, caused by tripping over laundry basket, injuring R shoulder. Pt sts she was "half asleep" and woke up immediately after falling. Pt denies other new injury or pain; denies hitting head. Pt has LFA abrasion from a week ago from a horse. Abrasion to the the R trapezius area with tenderness to palpation.  Pt A&Ox4 at this time. Strong R radial pulse.   Last liquid intake was 0330, and last solid intake was 1800.

## 2020-07-01 ENCOUNTER — Telehealth: Payer: Self-pay | Admitting: Internal Medicine

## 2020-07-01 NOTE — Telephone Encounter (Signed)
Patient has a seperated shoulder . She went to the ER and she can't return to work until she has a Physicist, medical of clearance. The patient is wanting to discuss her options with you. Please call and advise patient EM

## 2020-07-05 NOTE — Progress Notes (Signed)
Saliyah Gillin T. Joette Schmoker, MD, CAQ Sports Medicine  Primary Care and Sports Medicine [facility] at [facility] [address] [place], [address]  Phone: [phone]  FAX: [phone]  Shakirra Buehler - 65 y.o. female  MRN [id number]  Date of Birth: [birth year]  Date: 07/06/2020  PCP: Lorre Munroe, NP  Referral: Lorre Munroe, NP  Chief Complaint  Patient presents with  . Follow-up    ED 06/29/20-Acromioclavicular [facility]) joint injury, right,     This visit occurred during the SARS-CoV-2 public health emergency.  Safety protocols were in place, including screening questions prior to the visit, additional usage of staff PPE, and extensive cleaning of exam room while observing appropriate contact time as indicated for disinfecting solutions.   Subjective:   Jonice Cerra is a 65 y.o. very pleasant female patient with Body mass index is 21.57 kg/m. who presents with the following:  She was seen in the ER on 06/29/2020 after a fall at home.  At that time she tripped over a laundry basket and landed on her arm.  At that time, she was placed in a sling and she is here in follow-up today. On my review, this looks like she has a type II vs III AC joint separation.  She is here today in a sling, she does have some bruising on the top of her shoulder.  She does have pain with general movement of the shoulder.  She is not having any significant chest pain, anterior chest pain, humeral pain or with any significant pain in the elbow.  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  She does work for [employer]  Review of Systems is noted in the HPI, as appropriate   Objective:   BP (!) 132/94   Pulse 74   Temp 98.1 F (36.7 C) (Temporal)   Ht 5' 0.75" (1.543 m)   Wt 113 lb 4 oz (51.4 kg)   SpO2 99%   BMI 21.57 kg/m   Right shoulder: Range of motion is limited, but she is able to easily  actively flex and abduct the shoulder to 90 degrees.  Rotational movements are fine without abduction.  Strength in all directions is 5/5.  She is notably tender on the [facility] joint.  Nontender throughout the humerus, ulna, radius and she has full range of motion at the elbow without any difficulty with movement.  Radiology: DG Shoulder Right  Result Date: 06/29/2020 CLINICAL DATA:  Fall, right shoulder pain EXAM: RIGHT SHOULDER - 2+ VIEW COMPARISON:  None. FINDINGS: Elevation of the distal right clavicular head relative to the acromion. Background of hypertrophic degenerative arthrosis at the acromioclavicular joint. No other acute or conspicuous osseous lesion. Humeral head is well seated within the glenoid with only mild glenohumeral arthrosis. No acute traumatic abnormality of the right chest wall. IMPRESSION: 1. Elevation of the distal right clavicular head relative to the acromion, consistent with a [place] type II/III acromioclavicular joint injury. 2. Background of moderate hypertrophic acromioclavicular and mild glenohumeral arthrosis. Electronically Signed   By: Kreg Shropshire M.D.   On: 06/29/2020 04:45    Assessment and Plan:     ICD-10-CM   1. AC separation, right, initial encounter  S43.101A    Total encounter time: 30 minutes. This includes total time spent on the day of encounter.  This includes chart review, ER note review, and independent review of x-rays. Type II  versus type III shoulder separation, suspect that she did tear her coracoclavicular ligaments based on her film.  Nevertheless, type II and III injuries generally do well from a conservative management standpoint.  I am going to advance her range of motion and basic isometric and basic light Thera-Band movements.  Modified work duties and letter given.  Maximal lifting 10 pounds for 3 weeks.  Follow-up: Return in about 1 month (around 08/03/2020).  Signed,  Elpidio Galea. Willadene Mounsey, MD   Outpatient Encounter  Medications as of 07/06/2020  Medication Sig  . desvenlafaxine (PRISTIQ) 50 MG 24 hr tablet TAKE ONE TABLET BY MOUTH DAILY  . losartan (COZAAR) 50 MG tablet TAKE 1 TABLET BY MOUTH EVERY DAY  . metaxalone (SKELAXIN) 800 MG tablet Take 0.5-1 tablets (400-800 mg total) by mouth 3 (three) times daily as needed for muscle spasms.  . Multiple Vitamin (MULTIVITAMIN) tablet Take 1 tablet by mouth daily.  Marland Kitchen oxyCODONE-acetaminophen (PERCOCET) 5-325 MG tablet Take 2 tablets by mouth every 6 (six) hours as needed for severe pain.  Marland Kitchen Raspberry Ketones 100 MG CAPS Take by mouth.  . [DISCONTINUED] docusate sodium (COLACE) 100 MG capsule Take 1 tablet once or twice daily as needed for constipation while taking narcotic pain medicine   No facility-administered encounter medications on file as of 07/06/2020.

## 2020-07-06 ENCOUNTER — Other Ambulatory Visit: Payer: Self-pay

## 2020-07-06 ENCOUNTER — Encounter: Payer: Self-pay | Admitting: Family Medicine

## 2020-07-06 ENCOUNTER — Ambulatory Visit: Payer: BC Managed Care – PPO | Admitting: Family Medicine

## 2020-07-06 VITALS — BP 132/94 | HR 74 | Temp 98.1°F | Ht 60.75 in | Wt 113.2 lb

## 2020-07-06 DIAGNOSIS — S43101A Unspecified dislocation of right acromioclavicular joint, initial encounter: Secondary | ICD-10-CM | POA: Diagnosis not present

## 2020-07-09 ENCOUNTER — Ambulatory Visit: Payer: BC Managed Care – PPO | Admitting: Internal Medicine

## 2020-07-11 ENCOUNTER — Other Ambulatory Visit: Payer: Self-pay | Admitting: Internal Medicine

## 2020-07-11 DIAGNOSIS — I10 Essential (primary) hypertension: Secondary | ICD-10-CM

## 2020-07-14 ENCOUNTER — Encounter: Payer: Self-pay | Admitting: Internal Medicine

## 2020-07-14 ENCOUNTER — Other Ambulatory Visit: Payer: Self-pay

## 2020-07-14 ENCOUNTER — Ambulatory Visit: Payer: BC Managed Care – PPO | Admitting: Internal Medicine

## 2020-07-14 DIAGNOSIS — I10 Essential (primary) hypertension: Secondary | ICD-10-CM | POA: Diagnosis not present

## 2020-07-14 DIAGNOSIS — F5101 Primary insomnia: Secondary | ICD-10-CM

## 2020-07-14 MED ORDER — TRAZODONE HCL 50 MG PO TABS: 25.0000 mg | ORAL_TABLET | Freq: Every evening | ORAL | 0 refills | Status: DC | PRN

## 2020-07-14 NOTE — Assessment & Plan Note (Signed)
Increase Losartan to 75 mg daily Reinforced DASH diet  RTC in 10 days for follow up HTN

## 2020-07-14 NOTE — Patient Instructions (Signed)

## 2020-07-14 NOTE — Assessment & Plan Note (Signed)
Will trial Trazadone 

## 2020-07-14 NOTE — Progress Notes (Signed)
Subjective:    Patient ID: Carrie Beard, female    DOB: [birth year], 65 y.o.   MRN: [id number]  HPI  Carrie Beard presents to the clinic today to follow up HTN. She has noticed that her blood pressure has been high every time she goes to the doctor. She is not monitoring it at home. She denies headaches, dizziness, visual changes, chest pain or shortness of breath. She is taking Losartan as prescribed.  She also reports difficulty staying asleep. She is able to fall asleep. She has tried Melatonin without any relief. She report Vodka will help her fall asleep. She has taken Ambien and Trazadone in the past. She has not had a sleep study.  Review of Systems  Past Medical History:  Diagnosis Date  . Allergy   . Thyroid disease     Current Outpatient Medications  Medication Sig Dispense Refill  . desvenlafaxine (PRISTIQ) 50 MG 24 hr tablet TAKE ONE TABLET BY MOUTH DAILY 90 tablet 0  . losartan (COZAAR) 50 MG tablet TAKE 1 TABLET BY MOUTH EVERY DAY 90 tablet 0  . metaxalone (SKELAXIN) 800 MG tablet Take 0.5-1 tablets (400-800 mg total) by mouth 3 (three) times daily as needed for muscle spasms. 30 tablet 2  . Multiple Vitamin (MULTIVITAMIN) tablet Take 1 tablet by mouth daily.    Marland Kitchen oxyCODONE-acetaminophen (PERCOCET) 5-325 MG tablet Take 2 tablets by mouth every 6 (six) hours as needed for severe pain. 16 tablet 0  . Raspberry Ketones 100 MG CAPS Take by mouth.     No current facility-administered medications for this visit.    Allergies  Allergen Reactions  . Fire [employer]   . Tramadol Nausea And Vomiting    Family History  Problem Relation Age of Onset  . Hypertension Mother   . Hypercholesterolemia Mother   . Diabetes Father   . Cancer Father        skin, prostate  . Heart disease Father   . Hyperlipidemia Father   . Kidney disease Father   . Diabetes Paternal Grandfather     Social History   Socioeconomic History  . Marital status: Single    Spouse name: Not on file  . Number  of children: 0  . Years of education: Not on file  . Highest education level: Not on file  Occupational History  . Occupation: Insurance claims handler  Tobacco Use  . Smoking status: Former Smoker    Quit date: 05/09/1998    Years since quitting: 22.1  . Smokeless tobacco: Never Used  Vaping Use  . Vaping Use: Never used  Substance and Sexual Activity  . Alcohol use: Yes    Alcohol/week: 0.0 standard drinks    Comment: social rare  . Drug use: No  . Sexual activity: Not Currently    Birth control/protection: Abstinence  Other Topics Concern  . Not on file  Social History Narrative   Single   No children   Rides horses   Social Determinants of Health   Financial Resource Strain: Not on file  Food Insecurity: Not on file  Transportation Needs: Not on file  Physical Activity: Not on file  Stress: Not on file  Social Connections: Not on file  Intimate Partner Violence: Not on file     Constitutional: Denies fever, malaise, fatigue, headache or abrupt weight changes.  Respiratory: Denies difficulty breathing, shortness of breath, cough or sputum production.   Cardiovascular: Denies chest pain, chest tightness, palpitations or swelling in the hands or feet.  Neurological:  Carrie Beard reports insomnia. Denies dizziness, difficulty with memory, difficulty with speech or problems with balance and coordination.  Psych: Denies anxiety, depression, SI/HI.  No other specific complaints in a complete review of systems (except as listed in HPI above).     Objective:   Physical Exam  BP (!) 144/80   Pulse 66   Temp (!) 96.5 F (35.8 C) (Temporal)   Wt 113 lb (51.3 kg)   SpO2 98%   BMI 21.53 kg/m   Wt Readings from Last 3 Encounters:  07/06/20 113 lb 4 oz (51.4 kg)  06/29/20 110 lb (49.9 kg)  10/02/19 107 lb (48.5 kg)    General: Appears her stated age, in NAD. Cardiovascular: Normal rate. Pulmonary/Chest: Normal effort. Neurological: Alert and oriented.    BMET    Component  Value Date/Time   NA 140 10/02/2019 1612   NA 140 10/14/2016 0844   K 4.2 10/02/2019 1612   CL 105 10/02/2019 1612   CO2 30 10/02/2019 1612   GLUCOSE 106 (H) 10/02/2019 1612   BUN 19 10/02/2019 1612   BUN 17 10/14/2016 0844   CREATININE 0.90 10/02/2019 1612   CREATININE 0.90 10/12/2015 0946   CALCIUM 9.4 10/02/2019 1612   GFRNONAA 60 10/14/2016 0844   GFRNONAA 71 10/12/2015 0946   GFRAA 69 10/14/2016 0844   GFRAA 82 10/12/2015 0946    Lipid Panel     Component Value Date/Time   CHOL 231 (H) 09/19/2019 1538   CHOL 220 (H) 10/14/2016 0844   TRIG 70.0 09/19/2019 1538   HDL 107.10 09/19/2019 1538   HDL 107 10/14/2016 0844   CHOLHDL 2 09/19/2019 1538   VLDL 14.0 09/19/2019 1538   LDLCALC 110 (H) 09/19/2019 1538   LDLCALC 98 10/14/2016 0844    CBC    Component Value Date/Time   WBC 9.1 09/19/2019 1538   RBC 4.69 09/19/2019 1538   HGB 14.1 09/19/2019 1538   HGB 14.3 10/14/2016 0844   HCT 41.4 09/19/2019 1538   HCT 44.2 10/14/2016 0844   PLT 269.0 09/19/2019 1538   PLT 268 10/14/2016 0844   MCV 88.2 09/19/2019 1538   MCV 89 10/14/2016 0844   MCH 28.8 10/14/2016 0844   MCH 29.1 10/12/2015 0946   MCHC 34.0 09/19/2019 1538   RDW 13.2 09/19/2019 1538   RDW 14.3 10/14/2016 0844   LYMPHSABS 1.8 10/14/2016 0844   MONOABS 408 10/12/2015 0946   EOSABS 0.1 10/14/2016 0844   BASOSABS 0.0 10/14/2016 0844    Hgb A1C Lab Results  Component Value Date   HGBA1C 5.2 11/17/2012           Assessment & Plan:     Nicki Reaper, NP This visit occurred during the SARS-CoV-2 public health emergency.  Safety protocols were in place, including screening questions prior to the visit, additional usage of staff PPE, and extensive cleaning of exam room while observing appropriate contact time as indicated for disinfecting solutions.

## 2020-07-27 ENCOUNTER — Telehealth: Payer: Self-pay | Admitting: *Deleted

## 2020-07-27 NOTE — Telephone Encounter (Signed)
Work note sent to [employer] as requested.

## 2020-07-27 NOTE — Telephone Encounter (Signed)
Patient left a voicemail stating that she saw a doctor here a few weeks ago because of a separated shoulder. Patient stated that she was given a note to be on light duty for 3 weeks. Patient stated that she is at the end of the 3 weeks. 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 Left a message on voicemail for patient to call back.  Need to get an update on patient and how her shoulder is doing.

## 2020-07-27 NOTE — Telephone Encounter (Signed)
Patient called back stating that her shoulder is doing just fine. Patient stated that she is back on regular duty but the nurse is insisting that she have a letter. Patient wants to know if the letter can be sent to her mychart so that she can print it off.

## 2020-07-27 NOTE — Telephone Encounter (Signed)
This is fine.    "The patient can return to work without restrictions."

## 2020-07-28 ENCOUNTER — Other Ambulatory Visit: Payer: Self-pay

## 2020-07-28 ENCOUNTER — Ambulatory Visit: Payer: BC Managed Care – PPO

## 2020-07-28 DIAGNOSIS — I10 Essential (primary) hypertension: Secondary | ICD-10-CM

## 2020-07-28 MED ORDER — LOSARTAN POTASSIUM 50 MG PO TABS: 50.0000 mg | ORAL_TABLET | Freq: Every day | ORAL | 1 refills | Status: DC

## 2020-07-28 NOTE — Progress Notes (Signed)
Pt came in for BP Check. Verified with Carrie Beard that she was good with 136/74. Sent refill of losartan to [facility] at pt's request. She will be following Rene Kocher to new office.

## 2020-09-21 ENCOUNTER — Encounter: Payer: BC Managed Care – PPO | Admitting: Internal Medicine

## 2020-09-28 ENCOUNTER — Other Ambulatory Visit: Payer: Self-pay | Admitting: Internal Medicine

## 2020-09-28 ENCOUNTER — Other Ambulatory Visit: Payer: Self-pay

## 2020-09-28 ENCOUNTER — Encounter: Payer: Self-pay | Admitting: Internal Medicine

## 2020-09-28 ENCOUNTER — Ambulatory Visit (INDEPENDENT_AMBULATORY_CARE_PROVIDER_SITE_OTHER): Payer: BC Managed Care – PPO | Admitting: Internal Medicine

## 2020-09-28 VITALS — BP 147/82 | HR 65 | Temp 97.6°F | Resp 17 | Ht 60.75 in | Wt 116.4 lb

## 2020-09-28 DIAGNOSIS — I10 Essential (primary) hypertension: Secondary | ICD-10-CM

## 2020-09-28 DIAGNOSIS — E0590 Thyrotoxicosis, unspecified without thyrotoxic crisis or storm: Secondary | ICD-10-CM

## 2020-09-28 DIAGNOSIS — F419 Anxiety disorder, unspecified: Secondary | ICD-10-CM | POA: Diagnosis not present

## 2020-09-28 DIAGNOSIS — F32A Depression, unspecified: Secondary | ICD-10-CM

## 2020-09-28 DIAGNOSIS — Z1231 Encounter for screening mammogram for malignant neoplasm of breast: Secondary | ICD-10-CM | POA: Diagnosis not present

## 2020-09-28 DIAGNOSIS — F5101 Primary insomnia: Secondary | ICD-10-CM

## 2020-09-28 DIAGNOSIS — Z0001 Encounter for general adult medical examination with abnormal findings: Secondary | ICD-10-CM

## 2020-09-28 DIAGNOSIS — Z780 Asymptomatic menopausal state: Secondary | ICD-10-CM | POA: Diagnosis not present

## 2020-09-28 MED ORDER — TRAZODONE HCL 50 MG PO TABS: 25.0000 mg | ORAL_TABLET | Freq: Every evening | ORAL | 0 refills | Status: DC | PRN

## 2020-09-28 NOTE — Assessment & Plan Note (Signed)
TSH and free T4 today Not medicated, will monitor

## 2020-09-28 NOTE — Assessment & Plan Note (Signed)
Continue Trazodone as needed 

## 2020-09-28 NOTE — Assessment & Plan Note (Signed)
Repeat manual BP 146/72 Continue Losartan as prescribed Reinforced DASH diet and exercise for weight loss

## 2020-09-28 NOTE — Progress Notes (Signed)
Subjective:    Patient ID: Carrie Beard, female    DOB: [birth year], 65 y.o.   MRN: [id number]  HPI  Pt presents to the clinic today for her annual exam. She is also due to follow up chronic conditions.  HTN: Her BP today is 170/69. She is taking Losartan as prescribed. ECG from 10/2014 reviewed.  Hyperthyroidism: She is not taking any medications for this. She does not follow with endocrinology.  Anxiety and Depression: Chronic, managed on Desvenlafaxine. She is not currently seeing a therapist. She denies SI/HI.  Insomnia: She has difficulty staying asleep. She takes Trazadone as needed with good relief of symptoms. There is no sleep study on file.  Flu: 02/2020 Tetanus: 11/2012 Covid: Pfizer x 4 Shingrix: never Pap smear: 10/2016 Mammogram: 11/2016 Bone density: never Colon screening: 10/2019 Vision screening: as needed Dentist: as needed  Diet: She does eat meat. She consumes fruits and veggies. She occassionally eats fried foods. She drinks mostly vodka. Exercise: Horseback riding, weights.  Review of Systems  Past Medical History:  Diagnosis Date  . Allergy   . Thyroid disease     Current Outpatient Medications  Medication Sig Dispense Refill  . desvenlafaxine (PRISTIQ) 50 MG 24 hr tablet TAKE ONE TABLET BY MOUTH DAILY 90 tablet 0  . losartan (COZAAR) 50 MG tablet Take 1 tablet (50 mg total) by mouth daily. 90 tablet 1  . Multiple Vitamin (MULTIVITAMIN) tablet Take 1 tablet by mouth daily.    Marland Kitchen Raspberry Ketones 100 MG CAPS Take by mouth.    . traZODone (DESYREL) 50 MG tablet Take 0.5-1 tablets (25-50 mg total) by mouth at bedtime as needed for sleep. 30 tablet 0   No current facility-administered medications for this visit.    Allergies  Allergen Reactions  . Fire [employer]   . Tramadol Nausea And Vomiting    Family History  Problem Relation Age of Onset  . Hypertension Mother   . Hypercholesterolemia Mother   . Diabetes Father   . Cancer Father        skin,  prostate  . Heart disease Father   . Hyperlipidemia Father   . Kidney disease Father   . Diabetes Paternal Grandfather     Social History   Socioeconomic History  . Marital status: Single    Spouse name: Not on file  . Number of children: 0  . Years of education: Not on file  . Highest education level: Not on file  Occupational History  . Occupation: Insurance claims handler  Tobacco Use  . Smoking status: Former Smoker    Quit date: 05/09/1998    Years since quitting: 22.4  . Smokeless tobacco: Never Used  Vaping Use  . Vaping Use: Never used  Substance and Sexual Activity  . Alcohol use: Yes    Alcohol/week: 0.0 standard drinks    Comment: social rare  . Drug use: No  . Sexual activity: Not Currently    Birth control/protection: Abstinence  Other Topics Concern  . Not on file  Social History Narrative   Single   No children   Rides horses   Social Determinants of Health   Financial Resource Strain: Not on file  Food Insecurity: Not on file  Transportation Needs: Not on file  Physical Activity: Not on file  Stress: Not on file  Social Connections: Not on file  Intimate Partner Violence: Not on file     Constitutional: Denies fever, malaise, fatigue, headache or abrupt weight changes.  HEENT: Denies eye  pain, eye redness, ear pain, ringing in the ears, wax buildup, runny nose, nasal congestion, bloody nose, or sore throat. Respiratory: Denies difficulty breathing, shortness of breath, cough or sputum production.   Cardiovascular: Denies chest pain, chest tightness, palpitations or swelling in the hands or feet.  Gastrointestinal: Denies abdominal pain, bloating, constipation, diarrhea or blood in the stool.  GU: Denies urgency, frequency, pain with urination, burning sensation, blood in urine, odor or discharge. Musculoskeletal: Denies decrease in range of motion, difficulty with gait, muscle pain or joint pain and swelling.  Skin: Denies redness, rashes, lesions or  ulcercations.  Neurological: Pt reports insomnia. Denies dizziness, difficulty with memory, difficulty with speech or problems with balance and coordination.  Psych: Pt has a history of anxiety and depression. Denies SI/HI.  No other specific complaints in a complete review of systems (except as listed in HPI above).     Objective:   Physical Exam  BP (!) 170/69 (BP Location: Right Arm, Patient Position: Sitting, Cuff Size: Normal)   Pulse 65   Temp 97.6 F (36.4 C) (Temporal)   Resp 17   Ht 5' 0.75" (1.543 m)   Wt 116 lb 6.4 oz (52.8 kg)   SpO2 100%   BMI 22.17 kg/m   Wt Readings from Last 3 Encounters:  07/14/20 113 lb (51.3 kg)  07/06/20 113 lb 4 oz (51.4 kg)  06/29/20 110 lb (49.9 kg)    General: Appears her stated age, well developed, well nourished in NAD. Skin: Warm, dry and intact. No rashes noted. HEENT: Head: normal shape and size; Eyes: sclera white and EOMs intact;  Neck:  Neck supple, trachea midline. No masses, lumps or thyromegaly present.  Cardiovascular: Normal rate and rhythm. S1,S2 noted.  No murmur, rubs or gallops noted. No JVD or BLE edema. No carotid bruits noted. Pulmonary/Chest: Normal effort and positive vesicular breath sounds. No respiratory distress. No wheezes, rales or ronchi noted.  Abdomen: Soft and nontender. Normal bowel sounds. No distention or masses noted. Liver, spleen and kidneys non palpable. Musculoskeletal: Strength 5/5 BUE/BLE. No difficulty with gait.  Neurological: Alert and oriented. Cranial nerves II-XII grossly intact. Coordination normal.  Psychiatric: Mood and affect normal. Behavior is normal. Judgment and thought content normal.    BMET    Component Value Date/Time   NA 140 10/02/2019 1612   NA 140 10/14/2016 0844   K 4.2 10/02/2019 1612   CL 105 10/02/2019 1612   CO2 30 10/02/2019 1612   GLUCOSE 106 (H) 10/02/2019 1612   BUN 19 10/02/2019 1612   BUN 17 10/14/2016 0844   CREATININE 0.90 10/02/2019 1612    CREATININE 0.90 10/12/2015 0946   CALCIUM 9.4 10/02/2019 1612   GFRNONAA 60 10/14/2016 0844   GFRNONAA 71 10/12/2015 0946   GFRAA 69 10/14/2016 0844   GFRAA 82 10/12/2015 0946    Lipid Panel     Component Value Date/Time   CHOL 231 (H) 09/19/2019 1538   CHOL 220 (H) 10/14/2016 0844   TRIG 70.0 09/19/2019 1538   HDL 107.10 09/19/2019 1538   HDL 107 10/14/2016 0844   CHOLHDL 2 09/19/2019 1538   VLDL 14.0 09/19/2019 1538   LDLCALC 110 (H) 09/19/2019 1538   LDLCALC 98 10/14/2016 0844    CBC    Component Value Date/Time   WBC 9.1 09/19/2019 1538   RBC 4.69 09/19/2019 1538   HGB 14.1 09/19/2019 1538   HGB 14.3 10/14/2016 0844   HCT 41.4 09/19/2019 1538   HCT 44.2 10/14/2016 0844  PLT 269.0 09/19/2019 1538   PLT 268 10/14/2016 0844   MCV 88.2 09/19/2019 1538   MCV 89 10/14/2016 0844   MCH 28.8 10/14/2016 0844   MCH 29.1 10/12/2015 0946   MCHC 34.0 09/19/2019 1538   RDW 13.2 09/19/2019 1538   RDW 14.3 10/14/2016 0844   LYMPHSABS 1.8 10/14/2016 0844   MONOABS 408 10/12/2015 0946   EOSABS 0.1 10/14/2016 0844   BASOSABS 0.0 10/14/2016 0844    Hgb A1C Lab Results  Component Value Date   HGBA1C 5.2 11/17/2012           Assessment & Plan:   Preventative Health Maintenance:  Encouraged her to get a flu shot in the fall Tetanus UTD Covid UTD Encouraged her to call her insurance company to check coverage on Shingrix vaccine Pap smear due 2023 Mammogram ordered she will call to schedule Bone density ordered she will call to schedule Colon screening UTD Encouraged her to consume a balanced diet and exercise regimen Advised her to see an eye doctor and dentist annually Will check CBC, CMET, TSH, Free T4, lipid profile today  RTC in 1 year, sooner if needed    Nicki Reaper, NP This visit occurred during the SARS-CoV-2 public health emergency.  Safety protocols were in place, including screening questions prior to the visit, additional usage of staff PPE, and  extensive cleaning of exam room while observing appropriate contact time as indicated for disinfecting solutions.

## 2020-09-28 NOTE — Assessment & Plan Note (Signed)
Stable on Desvenlafaxine Support offered Will monitor

## 2020-09-29 LAB — CBC
HCT: 39.3 % (ref 35.0–45.0)
Hemoglobin: 13.5 g/dL (ref 11.7–15.5)
MCH: 31.3 pg (ref 27.0–33.0)
MCHC: 34.4 g/dL (ref 32.0–36.0)
MCV: 91 fL (ref 80.0–100.0)
MPV: 9.5 fL (ref 7.5–12.5)
Platelets: 287 10*3/uL (ref 140–400)
RBC: 4.32 10*6/uL (ref 3.80–5.10)
RDW: 13.3 % (ref 11.0–15.0)
WBC: 7.6 10*3/uL (ref 3.8–10.8)

## 2020-09-29 LAB — COMPREHENSIVE METABOLIC PANEL
AG Ratio: 1.4 (calc) (ref 1.0–2.5)
ALT: 18 U/L (ref 6–29)
AST: 20 U/L (ref 10–35)
Albumin: 4.2 g/dL (ref 3.6–5.1)
Alkaline phosphatase (APISO): 61 U/L (ref 37–153)
BUN/Creatinine Ratio: 22 (calc) (ref 6–22)
BUN: 26 mg/dL — ABNORMAL HIGH (ref 7–25)
CO2: 25 mmol/L (ref 20–32)
Calcium: 9.1 mg/dL (ref 8.6–10.4)
Chloride: 101 mmol/L (ref 98–110)
Creat: 1.18 mg/dL — ABNORMAL HIGH (ref 0.50–0.99)
Globulin: 3 g/dL (calc) (ref 1.9–3.7)
Glucose, Bld: 98 mg/dL (ref 65–99)
Potassium: 4.4 mmol/L (ref 3.5–5.3)
Sodium: 138 mmol/L (ref 135–146)
Total Bilirubin: 0.4 mg/dL (ref 0.2–1.2)
Total Protein: 7.2 g/dL (ref 6.1–8.1)

## 2020-09-29 LAB — TSH: TSH: 0.88 mIU/L (ref 0.40–4.50)

## 2020-09-29 LAB — LIPID PANEL
Cholesterol: 243 mg/dL — ABNORMAL HIGH (ref <200)
HDL: 138 mg/dL (ref >=50)
LDL Cholesterol (Calc): 90 mg/dL (calc)
Non-HDL Cholesterol (Calc): 105 mg/dL (calc) (ref <130)
Total CHOL/HDL Ratio: 1.8 (calc) (ref <5.0)
Triglycerides: 61 mg/dL (ref <150)

## 2020-09-29 LAB — SPECIMEN COMPROMISED

## 2020-09-29 LAB — EXTRA LAV TOP TUBE

## 2020-09-29 LAB — T4, FREE: Free T4: 1 ng/dL (ref 0.8–1.8)

## 2020-10-09 ENCOUNTER — Other Ambulatory Visit: Payer: Self-pay | Admitting: Internal Medicine

## 2020-10-09 NOTE — Telephone Encounter (Signed)
Requested medication (s) are due for refill today: na  Requested medication (s) are on the active medication list: yes  Last refill:  07/14/20 #90 0 refills  Future visit scheduled: last seen 09/28/20 at [facility]  Notes to clinic:  do you want to refill Rx?     Requested Prescriptions  Pending Prescriptions Disp Refills   desvenlafaxine (PRISTIQ) 50 MG 24 hr tablet [Pharmacy Med Name: DESVENLAFAXINE SUCCNT ER 50 MG] 90 tablet 0    Sig: TAKE ONE TABLET BY MOUTH DAILY      There is no refill protocol information for this order

## 2020-12-01 ENCOUNTER — Ambulatory Visit
Admission: RE | Admit: 2020-12-01 | Discharge: 2020-12-01 | Disposition: A | Discharge status: Home / Self Care | Payer: BC Managed Care – PPO | Source: Ambulatory Visit | Attending: Internal Medicine | Admitting: Internal Medicine

## 2020-12-01 ENCOUNTER — Encounter: Payer: Self-pay | Admitting: Internal Medicine

## 2020-12-01 ENCOUNTER — Ambulatory Visit: Payer: BC Managed Care – PPO | Admitting: Internal Medicine

## 2020-12-01 ENCOUNTER — Ambulatory Visit
Admission: RE | Admit: 2020-12-01 | Discharge: 2020-12-01 | Disposition: A | Discharge status: Home / Self Care | Payer: BC Managed Care – PPO | Attending: Internal Medicine | Admitting: Internal Medicine

## 2020-12-01 ENCOUNTER — Other Ambulatory Visit: Payer: Self-pay

## 2020-12-01 VITALS — BP 156/84 | HR 66 | Temp 97.5°F | Resp 17 | Ht 60.75 in | Wt 116.2 lb

## 2020-12-01 DIAGNOSIS — M25411 Effusion, right shoulder: Secondary | ICD-10-CM

## 2020-12-01 DIAGNOSIS — M25511 Pain in right shoulder: Secondary | ICD-10-CM

## 2020-12-01 MED ORDER — HYDROCODONE-ACETAMINOPHEN 5-325 MG PO TABS: 1.0000 | ORAL_TABLET | Freq: Four times a day (QID) | ORAL | 0 refills | Status: DC | PRN

## 2020-12-01 NOTE — Patient Instructions (Signed)
Shoulder Exercises Ask your health care provider which exercises are safe for you. Do exercises exactly as told by your health care provider and adjust them as directed. It is normal to feel mild stretching, pulling, tightness, or discomfort as you do these exercises. Stop right away if you feel sudden pain or your pain gets worse. Do not begin these exercises until told by your health care provider. Stretching exercises External rotation and abduction This exercise is sometimes called corner stretch. This exercise rotates your arm outward (external rotation) and moves your arm out from your body (abduction). Stand in a doorway with one of your feet slightly in front of the other. This is called a staggered stance. If you cannot reach your forearms to the door frame, stand facing a corner of a room. Choose one of the following positions as told by your health care provider: Place your hands and forearms on the door frame above your head. Place your hands and forearms on the door frame at the height of your head. Place your hands on the door frame at the height of your elbows. Slowly move your weight onto your front foot until you feel a stretch across your chest and in the front of your shoulders. Keep your head and chest upright and keep your abdominal muscles tight. Hold for __________ seconds. To release the stretch, shift your weight to your back foot. Repeat __________ times. Complete this exercise __________ times a day. Extension, standing Stand and hold a broomstick, a cane, or a similar object behind your back. Your hands should be a little wider than shoulder width apart. Your palms should face away from your back. Keeping your elbows straight and your shoulder muscles relaxed, move the stick away from your body until you feel a stretch in your shoulders (extension). Avoid shrugging your shoulders while you move the stick. Keep your shoulder blades tucked down toward the middle of your  back. Hold for __________ seconds. Slowly return to the starting position. Repeat __________ times. Complete this exercise __________ times a day. Range-of-motion exercises Pendulum  Stand near a wall or a surface that you can hold onto for balance. Bend at the waist and let your left / right arm hang straight down. Use your other arm to support you. Keep your back straight and do not lock your knees. Relax your left / right arm and shoulder muscles, and move your hips and your trunk so your left / right arm swings freely. Your arm should swing because of the motion of your body, not because you are using your arm or shoulder muscles. Keep moving your hips and trunk so your arm swings in the following directions, as told by your health care provider: Side to side. Forward and backward. In clockwise and counterclockwise circles. Continue each motion for __________ seconds, or for as long as told by your health care provider. Slowly return to the starting position. Repeat __________ times. Complete this exercise __________ times a day. Shoulder flexion, standing  Stand and hold a broomstick, a cane, or a similar object. Place your hands a little more than shoulder width apart on the object. Your left / right hand should be palm up, and your other hand should be palm down. Keep your elbow straight and your shoulder muscles relaxed. Push the stick up with your healthy arm to raise your left / right arm in front of your body, and then over your head until you feel a stretch in your shoulder (flexion). Avoid   shrugging your shoulder while you raise your arm. Keep your shoulder blade tucked down toward the middle of your back. Hold for __________ seconds. Slowly return to the starting position. Repeat __________ times. Complete this exercise __________ times a day. Shoulder abduction, standing Stand and hold a broomstick, a cane, or a similar object. Place your hands a little more than shoulder  width apart on the object. Your left / right hand should be palm up, and your other hand should be palm down. Keep your elbow straight and your shoulder muscles relaxed. Push the object across your body toward your left / right side. Raise your left / right arm to the side of your body (abduction) until you feel a stretch in your shoulder. Do not raise your arm above shoulder height unless your health care provider tells you to do that. If directed, raise your arm over your head. Avoid shrugging your shoulder while you raise your arm. Keep your shoulder blade tucked down toward the middle of your back. Hold for __________ seconds. Slowly return to the starting position. Repeat __________ times. Complete this exercise __________ times a day. Internal rotation  Place your left / right hand behind your back, palm up. Use your other hand to dangle an exercise band, a towel, or a similar object over your shoulder. Grasp the band with your left / right hand so you are holding on to both ends. Gently pull up on the band until you feel a stretch in the front of your left / right shoulder. The movement of your arm toward the center of your body is called internal rotation. Avoid shrugging your shoulder while you raise your arm. Keep your shoulder blade tucked down toward the middle of your back. Hold for __________ seconds. Release the stretch by letting go of the band and lowering your hands. Repeat __________ times. Complete this exercise __________ times a day. Strengthening exercises External rotation  Sit in a stable chair without armrests. Secure an exercise band to a stable object at elbow height on your left / right side. Place a soft object, such as a folded towel or a small pillow, between your left / right upper arm and your body to move your elbow about 4 inches (10 cm) away from your side. Hold the end of the exercise band so it is tight and there is no slack. Keeping your elbow pressed  against the soft object, slowly move your forearm out, away from your abdomen (external rotation). Keep your body steady so only your forearm moves. Hold for __________ seconds. Slowly return to the starting position. Repeat __________ times. Complete this exercise __________ times a day. Shoulder abduction  Sit in a stable chair without armrests, or stand up. Hold a __________ weight in your left / right hand, or hold an exercise band with both hands. Start with your arms straight down and your left / right palm facing in, toward your body. Slowly lift your left / right hand out to your side (abduction). Do not lift your hand above shoulder height unless your health care provider tells you that this is safe. Keep your arms straight. Avoid shrugging your shoulder while you do this movement. Keep your shoulder blade tucked down toward the middle of your back. Hold for __________ seconds. Slowly lower your arm, and return to the starting position. Repeat __________ times. Complete this exercise __________ times a day. Shoulder extension Sit in a stable chair without armrests, or stand up. Secure an exercise band   to a stable object in front of you so it is at shoulder height. Hold one end of the exercise band in each hand. Your palms should face each other. Straighten your elbows and lift your hands up to shoulder height. Step back, away from the secured end of the exercise band, until the band is tight and there is no slack. Squeeze your shoulder blades together as you pull your hands down to the sides of your thighs (extension). Stop when your hands are straight down by your sides. Do not let your hands go behind your body. Hold for __________ seconds. Slowly return to the starting position. Repeat __________ times. Complete this exercise __________ times a day. Shoulder row Sit in a stable chair without armrests, or stand up. Secure an exercise band to a stable object in front of you so it  is at waist height. Hold one end of the exercise band in each hand. Position your palms so that your thumbs are facing the ceiling (neutral position). Bend each of your elbows to a 90-degree angle (right angle) and keep your upper arms at your sides. Step back until the band is tight and there is no slack. Slowly pull your elbows back behind you. Hold for __________ seconds. Slowly return to the starting position. Repeat __________ times. Complete this exercise __________ times a day. Shoulder press-ups  Sit in a stable chair that has armrests. Sit upright, with your feet flat on the floor. Put your hands on the armrests so your elbows are bent and your fingers are pointing forward. Your hands should be about even with the sides of your body. Push down on the armrests and use your arms to lift yourself off the chair. Straighten your elbows and lift yourself up as much as you comfortably can. Move your shoulder blades down, and avoid letting your shoulders move up toward your ears. Keep your feet on the ground. As you get stronger, your feet should support less of your body weight as you lift yourself up. Hold for __________ seconds. Slowly lower yourself back into the chair. Repeat __________ times. Complete this exercise __________ times a day. Wall push-ups  Stand so you are facing a stable wall. Your feet should be about one arm-length away from the wall. Lean forward and place your palms on the wall at shoulder height. Keep your feet flat on the floor as you bend your elbows and lean forward toward the wall. Hold for __________ seconds. Straighten your elbows to push yourself back to the starting position. Repeat __________ times. Complete this exercise __________ times a day. This information is not intended to replace advice given to you by your health care provider. Make sure you discuss any questions you have with your healthcare provider. Document Revised: 08/17/2018 Document  Reviewed: 05/25/2018 [employer] Patient Education  2022 [employer]  

## 2020-12-01 NOTE — Progress Notes (Signed)
Subjective:    Patient ID: Carrie Beard, female    DOB: 1955-12-23, 65 y.o.   MRN: [id number]  HPI  Pt presents to the clinic today with c/o pain and swelling in her right shoulder. She noticed this 3 days ago. She describes the pain as sore and achy. The pain does not radiate. She intermittent has tingling in her right arm. She reports she fell 5 months ago, had a clavicular injury and diagnosed with an AC joint injury. She has been taking Tylenol OTC with minimal relief of symptoms.  Review of Systems  Past Medical History:  Diagnosis Date   Allergy    Thyroid disease     Current Outpatient Medications  Medication Sig Dispense Refill   desvenlafaxine (PRISTIQ) 50 MG 24 hr tablet TAKE ONE TABLET BY MOUTH DAILY 90 tablet 3   losartan (COZAAR) 50 MG tablet Take 1 tablet (50 mg total) by mouth daily. 90 tablet 1   Multiple Vitamin (MULTIVITAMIN) tablet Take 1 tablet by mouth daily.     Raspberry Ketones 100 MG CAPS Take by mouth.     traZODone (DESYREL) 50 MG tablet Take 0.5-1 tablets (25-50 mg total) by mouth at bedtime as needed for sleep. 30 tablet 0   No current facility-administered medications for this visit.    Allergies  Allergen Reactions   Fire Ant    Tramadol Nausea And Vomiting    Family History  Problem Relation Age of Onset   Hypertension Mother    Hypercholesterolemia Mother    Diabetes Father    Cancer Father        skin, prostate   Heart disease Father    Hyperlipidemia Father    Kidney disease Father    Diabetes Paternal Grandfather     Social History   Socioeconomic History   Marital status: Single    Spouse name: Not on file   Number of children: 0   Years of education: Not on file   Highest education level: Not on file  Occupational History   Occupation: Insurance claims handler  Tobacco Use   Smoking status: Former    Types: Cigarettes    Quit date: 05/09/1998    Years since quitting: 22.5   Smokeless tobacco: Never  Vaping Use   Vaping  Use: Never used  Substance and Sexual Activity   Alcohol use: Yes    Alcohol/week: 0.0 standard drinks    Comment: social rare   Drug use: No   Sexual activity: Not Currently    Birth control/protection: Abstinence  Other Topics Concern   Not on file  Social History Narrative   Single   No children   Rides horses   Social Determinants of Health   Financial Resource Strain: Not on file  Food Insecurity: Not on file  Transportation Needs: Not on file  Physical Activity: Not on file  Stress: Not on file  Social Connections: Not on file  Intimate Partner Violence: Not on file     Constitutional: Denies fever, malaise, fatigue, headache or abrupt weight changes.  Respiratory: Denies difficulty breathing, shortness of breath, cough or sputum production.   Cardiovascular: Denies chest pain, chest tightness, palpitations or swelling in the hands or feet.  Musculoskeletal: Pt reports right shoulder pain and swelling. Denies decrease in range of motion, difficulty with gait, muscle pain.  Skin: Denies redness, rashes, lesions or ulcercations.  Neurological: Pt reports intermittent tingling in right arm. Denies dizziness, difficulty with memory, difficulty with   No other specific  complaints in a complete review of systems (except as listed in HPI above).     Objective:   Physical Exam BP (!) 156/84 (BP Location: Left Arm, Patient Position: Sitting, Cuff Size: Small)   Pulse 66   Temp (!) 97.5 F (36.4 C) (Temporal)   Resp 17   Ht 5' 0.75" (1.543 m)   Wt 116 lb 3.2 oz (52.7 kg)   SpO2 100%   BMI 22.14 kg/m   Wt Readings from Last 3 Encounters:  09/28/20 116 lb 6.4 oz (52.8 kg)  07/14/20 113 lb (51.3 kg)  07/06/20 113 lb 4 oz (51.4 kg)    General: Appears her stated age, well developed, well nourished in NAD. Skin: Warm, dry and intact. No rashes noted. Cardiovascular: Normal rate. Pulmonary/Chest: Normal effort. Musculoskeletal: Normal internal and external rotation  of the right shoulder. Pain with palpation over the right AC joint. Obvious swelling/deformity noted over the right AC joint. Strength 5/5 BUE. Hand grips equal. Neurological: Alert and oriented.    BMET    Component Value Date/Time   NA 138 09/28/2020 1600   NA 140 10/14/2016 0844   K 4.4 09/28/2020 1600   CL 101 09/28/2020 1600   CO2 25 09/28/2020 1600   GLUCOSE 98 09/28/2020 1600   BUN 26 (H) 09/28/2020 1600   BUN 17 10/14/2016 0844   CREATININE 1.18 (H) 09/28/2020 1600   CALCIUM 9.1 09/28/2020 1600   GFRNONAA 60 10/14/2016 0844   GFRNONAA 71 10/12/2015 0946   GFRAA 69 10/14/2016 0844   GFRAA 82 10/12/2015 0946    Lipid Panel     Component Value Date/Time   CHOL 243 (H) 09/28/2020 1600   CHOL 220 (H) 10/14/2016 0844   TRIG 61 09/28/2020 1600   HDL 138 09/28/2020 1600   HDL 107 10/14/2016 0844   CHOLHDL 1.8 09/28/2020 1600   VLDL 14.0 09/19/2019 1538   LDLCALC 90 09/28/2020 1600    CBC    Component Value Date/Time   WBC 7.6 09/28/2020 1600   RBC 4.32 09/28/2020 1600   HGB 13.5 09/28/2020 1600   HGB 14.3 10/14/2016 0844   HCT 39.3 09/28/2020 1600   HCT 44.2 10/14/2016 0844   PLT 287 09/28/2020 1600   PLT 268 10/14/2016 0844   MCV 91.0 09/28/2020 1600   MCV 89 10/14/2016 0844   MCH 31.3 09/28/2020 1600   MCHC 34.4 09/28/2020 1600   RDW 13.3 09/28/2020 1600   RDW 14.3 10/14/2016 0844   LYMPHSABS 1.8 10/14/2016 0844   MONOABS 408 10/12/2015 0946   EOSABS 0.1 10/14/2016 0844   BASOSABS 0.0 10/14/2016 0844    Hgb A1C Lab Results  Component Value Date   HGBA1C 5.2 11/17/2012            Assessment & Plan:   Right Shoulder Pain and Swelling:  Xray right shoulder today RX for Vicodin 5-325 mg TID prn Encouraged ice Will likely need orthopedic follow up pending xray  Will follow up after xray results are back Nicki Reaper, NP This visit occurred during the SARS-CoV-2 public health emergency.  Safety protocols were in place, including screening  questions prior to the visit, additional usage of staff PPE, and extensive cleaning of exam room while observing appropriate contact time as indicated for disinfecting solutions.

## 2020-12-02 ENCOUNTER — Telehealth: Payer: Self-pay

## 2020-12-02 NOTE — Telephone Encounter (Signed)
Copied from CRM [id number]. Topic: General - Other >> Dec 02, 2020  8:38 AM Gaetana Michaelis A wrote: Reason for CRM: Patient would like to be contacted regarding their recent imaging results when available  Please contact further when available    The patient notified that the radiologist have not released her results yet.

## 2020-12-03 ENCOUNTER — Telehealth: Payer: Self-pay | Admitting: Internal Medicine

## 2020-12-03 DIAGNOSIS — S4991XD Unspecified injury of right shoulder and upper arm, subsequent encounter: Secondary | ICD-10-CM

## 2020-12-03 NOTE — Telephone Encounter (Signed)
Pt is calling for her xray results. Please advise CB-  [phone]

## 2020-12-04 NOTE — Telephone Encounter (Signed)
Did I not route this result note to you?

## 2020-12-04 NOTE — Addendum Note (Signed)
Addended by: Lorre Munroe on: 12/04/2020 11:15 AM   Modules accepted: Orders

## 2020-12-04 NOTE — Telephone Encounter (Signed)
Xray shows widening of the [facility] joint and a problem with the end of the clavicle. It is my recommendation that she sees an orthopedist. Is she agreeable with this?

## 2020-12-04 NOTE — Telephone Encounter (Signed)
Urgent referral to orthopedics placed.

## 2020-12-10 DIAGNOSIS — S4351XA Sprain of right acromioclavicular joint, initial encounter: Secondary | ICD-10-CM | POA: Diagnosis not present

## 2020-12-11 ENCOUNTER — Encounter (HOSPITAL_COMMUNITY): Payer: Self-pay

## 2020-12-11 ENCOUNTER — Other Ambulatory Visit: Payer: Self-pay

## 2020-12-11 ENCOUNTER — Emergency Department (HOSPITAL_COMMUNITY)
Admission: EM | Admit: 2020-12-11 | Discharge: 2020-12-12 | Disposition: A | Discharge status: Other Acute Inpatient Hospital | Payer: BC Managed Care – PPO | Attending: Emergency Medicine | Admitting: Emergency Medicine

## 2020-12-11 DIAGNOSIS — I10 Essential (primary) hypertension: Secondary | ICD-10-CM | POA: Insufficient documentation

## 2020-12-11 DIAGNOSIS — Z79899 Other long term (current) drug therapy: Secondary | ICD-10-CM | POA: Insufficient documentation

## 2020-12-11 DIAGNOSIS — R45851 Suicidal ideations: Secondary | ICD-10-CM

## 2020-12-11 DIAGNOSIS — Z046 Encounter for general psychiatric examination, requested by authority: Secondary | ICD-10-CM | POA: Diagnosis not present

## 2020-12-11 DIAGNOSIS — Y907 Blood alcohol level of 200-239 mg/100 ml: Secondary | ICD-10-CM | POA: Insufficient documentation

## 2020-12-11 DIAGNOSIS — F1010 Alcohol abuse, uncomplicated: Secondary | ICD-10-CM | POA: Diagnosis not present

## 2020-12-11 DIAGNOSIS — F332 Major depressive disorder, recurrent severe without psychotic features: Secondary | ICD-10-CM | POA: Insufficient documentation

## 2020-12-11 DIAGNOSIS — Z20822 Contact with and (suspected) exposure to covid-19: Secondary | ICD-10-CM | POA: Diagnosis not present

## 2020-12-11 DIAGNOSIS — Z87891 Personal history of nicotine dependence: Secondary | ICD-10-CM | POA: Insufficient documentation

## 2020-12-11 LAB — RAPID URINE DRUG SCREEN, HOSP PERFORMED
Amphetamines: NOT DETECTED
Barbiturates: NOT DETECTED
Benzodiazepines: NOT DETECTED
Cocaine: NOT DETECTED
Opiates: POSITIVE — AB
Tetrahydrocannabinol: NOT DETECTED

## 2020-12-11 LAB — CBC WITH DIFFERENTIAL/PLATELET
Abs Immature Granulocytes: 0.1 10*3/uL — ABNORMAL HIGH (ref 0.00–0.07)
Basophils Absolute: 0 10*3/uL (ref 0.0–0.1)
Basophils Relative: 1 %
Eosinophils Absolute: 0 10*3/uL (ref 0.0–0.5)
Eosinophils Relative: 0 %
HCT: 46.2 % — ABNORMAL HIGH (ref 36.0–46.0)
Hemoglobin: 15 g/dL (ref 12.0–15.0)
Immature Granulocytes: 1 %
Lymphocytes Relative: 20 %
Lymphs Abs: 1.7 10*3/uL (ref 0.7–4.0)
MCH: 30.2 pg (ref 26.0–34.0)
MCHC: 32.5 g/dL (ref 30.0–36.0)
MCV: 93.1 fL (ref 80.0–100.0)
Monocytes Absolute: 0.4 10*3/uL (ref 0.1–1.0)
Monocytes Relative: 4 %
Neutro Abs: 6.2 10*3/uL (ref 1.7–7.7)
Neutrophils Relative %: 74 %
Platelets: 298 10*3/uL (ref 150–400)
RBC: 4.96 MIL/uL (ref 3.87–5.11)
RDW: 14.5 % (ref 11.5–15.5)
WBC: 8.4 10*3/uL (ref 4.0–10.5)
nRBC: 0 % (ref 0.0–0.2)

## 2020-12-11 LAB — RESP PANEL BY RT-PCR (FLU A&B, COVID) ARPGX2
Influenza A by PCR: NEGATIVE
Influenza B by PCR: NEGATIVE
SARS Coronavirus 2 by RT PCR: NEGATIVE

## 2020-12-11 LAB — COMPREHENSIVE METABOLIC PANEL
ALT: 34 U/L (ref 0–44)
AST: 45 U/L — ABNORMAL HIGH (ref 15–41)
Albumin: 5.4 g/dL — ABNORMAL HIGH (ref 3.5–5.0)
Alkaline Phosphatase: 67 U/L (ref 38–126)
Anion gap: 16 — ABNORMAL HIGH (ref 5–15)
BUN: 24 mg/dL — ABNORMAL HIGH (ref 8–23)
CO2: 24 mmol/L (ref 22–32)
Calcium: 9.4 mg/dL (ref 8.9–10.3)
Chloride: 103 mmol/L (ref 98–111)
Creatinine, Ser: 0.92 mg/dL (ref 0.44–1.00)
GFR, Estimated: >60 mL/min (ref >60)
Glucose, Bld: 80 mg/dL (ref 70–99)
Potassium: 4.3 mmol/L (ref 3.5–5.1)
Sodium: 143 mmol/L (ref 135–145)
Total Bilirubin: 0.5 mg/dL (ref 0.3–1.2)
Total Protein: 9 g/dL — ABNORMAL HIGH (ref 6.5–8.1)

## 2020-12-11 LAB — ACETAMINOPHEN LEVEL: Acetaminophen (Tylenol), Serum: <10 ug/mL — ABNORMAL LOW (ref 10–30)

## 2020-12-11 LAB — SALICYLATE LEVEL: Salicylate Lvl: <7 mg/dL — ABNORMAL LOW (ref 7.0–30.0)

## 2020-12-11 LAB — ETHANOL: Alcohol, Ethyl (B): 237 mg/dL — ABNORMAL HIGH (ref <10)

## 2020-12-11 MED ORDER — LORAZEPAM 2 MG/ML IJ SOLN
2.0000 mg | Freq: Once | INTRAMUSCULAR | Status: AC
  Administered 2020-12-11: 2 mg via INTRAVENOUS
  Filled 2020-12-11: qty 1

## 2020-12-11 MED ORDER — LORAZEPAM 2 MG/ML IJ SOLN
1.0000 mg | INTRAMUSCULAR | Status: AC | PRN
  Administered 2020-12-11: 1 mg via INTRAVENOUS
  Filled 2020-12-11: qty 1

## 2020-12-11 NOTE — ED Provider Notes (Signed)
Emergency Medicine Provider Triage Evaluation Note  Carrie Beard , a 65 y.o. female  was evaluated in triage.  Pt complains of SI. Wanting to commit suicide, does attempt to drinking cocktails and taking 2 Norco. Placed under IVC by [employer] after calling suicide hotline. Pt states she has a plan with a gun, has access to gun.   Review of Systems  Positive: SI Negative: HI, hallucinations  Physical Exam  There were no vitals taken for this visit. Gen:   Awake, no distress   Resp:  Normal effort  MSK:   Moves extremities without difficulty  Other:  Pt stating that she doesn't want to be here, "cant take it anymore"  Medical Decision Making  Medically screening exam initiated at 12:40 PM.  Appropriate orders placed.  Carrie Beard was informed that the remainder of the evaluation will be completed by another provider, this initial triage assessment does not replace that evaluation, and the importance of remaining in the ED until their evaluation is complete.     Carrie Gordon, PA-C 12/11/20 1245    Derwood Kaplan, MD 12/11/20 1252

## 2020-12-11 NOTE — BH Assessment (Signed)
Comprehensive Clinical Assessment (CCA) Note  12/12/2020 Carrie Beard [id number]  DISPOSITION: Gave clinical to Carrie Bering, NP who determined Pt meets criteria for inpatient psychiatric treatment. Carrie Beard, [facility], confirmed adult unit is at capacity. Notified Dr Carrie Beard and Carrie Saucer, RN of recommendation.  The patient demonstrates the following risk factors for suicide: Chronic risk factors for suicide include: substance use disorder. Acute risk factors for suicide include: N/A. Protective factors for this patient include: responsibility to others (children, family). Considering these factors, the overall suicide risk at this point appears to be high. Patient is not appropriate for outpatient follow up.  Flowsheet Row ED from 12/11/2020 in [facility] from 06/29/2020 in [facility] EMERGENCY DEPARTMENT  C-SSRS RISK CATEGORY High Risk No Risk      Pt is a 65 year old single female who presents to [facility] ED via Patent examiner, who petitioned for IVC. Pt is a poor historian and had difficulty answering some questions appropriately. When asked why she came to the ED, Pt replies "My horse lost a shoe." Pt confirms that she called a suicide hotline and threatened to shoot herself. She confirms she continues to feel suicidal and that she has access to firearms. She denies any history of suicide attempts. Pt denies any history of intentional self-injurious behaviors. Pt describes her mood as depressed. She denies current homicidal ideation or history of violence. Pt denies any history of auditory or visual hallucinations.   Pt reports she drinks alcohol daily. She says she drinks a large glass of liquor daily and Pt's blood alcohol today was 237. Pt also was reported to have taken 2 Norco. Pt denies substance abuse other than alcohol. She reports she does experience blackouts and alcohol withdrawal, no seizures.   Pt  cannot identify any particular stressors other than indicating some concern about her horse. She says she lives alone and that she has several friends who are supportive. She states she has one brother and one sister. She says she has no children. 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 She denies history of abuse or trauma. She denies legal problems. Pt denies history of inpatient psychiatric or substance abuse treatment. She says she has been prescribed an antidepressant by her PCP.  Pt is dressed in hospital scrubs, drowsy and oriented to person, place. Pt speaks in a soft tone, at moderate volume and slow pace. Motor behavior appears normal. Eye contact is good. Pt's mood is depressed and affect is congruent with mood. Thought process is coherent at times and tangential at other times. There is no indication Pt is currently responding to internal stimuli or experiencing delusional thought content. She was generally cooperative during assessment.   Chief Complaint:  Chief Complaint  Patient presents with   Psychiatric Evaluation   Visit Diagnosis: F33.2 Major depressive disorder, Recurrent episode, Severe F10.20 Alcohol use disorder, Severe   CCA Screening, Triage and Referral (STR)  Patient Reported Information How did you hear about [place]? Other (Comment) Mudlogger)  Referral name: No data recorded Referral phone number: No data recorded  Whom do you see for routine medical problems? No data recorded Practice/Facility Name: No data recorded Practice/Facility Phone Number: No data recorded Name of Contact: No data recorded Contact Number: No data recorded Contact Fax Number: No data recorded Prescriber Name: No data recorded Prescriber Address (if known): No data recorded  What Is the Reason for Your Visit/Call Today? Pt called  suicide hotline and said she was suicidal with a plan to shoot herself. Pt confirms suicidal ideation and that she has access to  firearms.  How Long Has This Been Causing You Problems? 1 wk - 1 month  What Do You Feel Would Help You the Most Today? Treatment for Depression or other mood problem; Alcohol or Drug Use Treatment   Have You Recently Been in Any Inpatient Treatment (Hospital/Detox/Crisis Center/28-Day Program)? No data recorded Name/Location of Program/Hospital:No data recorded How Long Were You There? No data recorded When Were You Discharged? No data recorded  Have You Ever Received Services From [facility] Before? No data recorded Who Do You See at [facility]? No data recorded  Have You Recently Had Any Thoughts About Hurting Yourself? Yes  Are You Planning to Commit Suicide/Harm Yourself At This time? Yes   Have you Recently Had Thoughts About Hurting Someone Carrie Beard? No  Explanation: No data recorded  Have You Used Any Alcohol or Drugs in the Past 24 Hours? Yes  How Long Ago Did You Use Drugs or Alcohol? No data recorded What Did You Use and How Much? Unknown quantity of alcohol and took two tabs of Narco   Do You Currently Have a Therapist/Psychiatrist? No  Name of Therapist/Psychiatrist: No data recorded  Have You Been Recently Discharged From Any Office Practice or Programs? No  Explanation of Discharge From Practice/Program: No data recorded    CCA Screening Triage Referral Assessment Type of Contact: Tele-Assessment  Is this Initial or Reassessment? Initial Assessment  Date Telepsych consult ordered in [facility]:  12/11/20  Time Telepsych consult ordered [facility]:  1620   Patient Reported Information Reviewed? No data recorded Patient Left Without Being Seen? No data recorded Reason for Not Completing Assessment: No data recorded  Collateral Involvement: None   Does Patient Have a Court Appointed Legal Guardian? No data recorded Name and Contact of Legal Guardian: No data recorded If Minor and Not Living with Parent(s), Who has Custody? NA  Is CPS involved or ever been  involved? Never  Is APS involved or ever been involved? Never   Patient Determined To Be At Risk for Harm To Self or Others Based on Review of Patient Reported Information or Presenting Complaint? Yes, for Self-Harm  Method: No data recorded Availability of Means: No data recorded Intent: No data recorded Notification Required: No data recorded Additional Information for Danger to Others Potential: No data recorded Additional Comments for Danger to Others Potential: No data recorded Are There Guns or Other Weapons in Your Home? No data recorded Types of Guns/Weapons: No data recorded Are These Weapons Safely Secured?                            No data recorded Who Could Verify You Are Able To Have These Secured: No data recorded Do You Have any Outstanding Charges, Pending Court Dates, Parole/Probation? No data recorded Contacted To Inform of Risk of Harm To Self or Others: Unable to Contact:   Location of Assessment: [facility] ED   Does Patient Present under Involuntary Commitment? Yes  IVC Papers Initial File Date: 12/11/20   [place]   Patient Currently Receiving the Following Services: Not Receiving Services   Determination of Need: Emergent (2 hours)   Options For Referral: Inpatient Hospitalization     CCA Biopsychosocial Intake/Chief Complaint:  No data recorded Current Symptoms/Problems: No data recorded  Patient Reported Schizophrenia/Schizoaffective Diagnosis in  Past: No   Strengths: Pt works and has good support system  Preferences: No data recorded Abilities: No data recorded  Type of Services Patient Feels are Needed: No data recorded  Initial Clinical Notes/Concerns: No data recorded  Mental Health Symptoms Depression:   Change in energy/activity; Difficulty Concentrating; Fatigue; Hopelessness; Irritability; Tearfulness; Worthlessness   Duration of Depressive symptoms:  Greater than two weeks   Mania:   None   Anxiety:     Worrying; Tension   Psychosis:   None   Duration of Psychotic symptoms: No data recorded  Trauma:   None   Obsessions:   None   Compulsions:   None   Inattention:   N/A   Hyperactivity/Impulsivity:   N/A   Oppositional/Defiant Behaviors:   N/A   Emotional Irregularity:   None   Other Mood/Personality Symptoms:   None    Mental Status Exam Appearance and self-care  Stature:   Small   Weight:   Average weight   Clothing:   -- (Scrubs)   Grooming:   Normal   Cosmetic use:   Age appropriate   Posture/gait:   Normal   Motor activity:   Not Remarkable   Sensorium  Attention:   Distractible   Concentration:   Variable   Orientation:   X5   Recall/memory:   Normal   Affect and Mood  Affect:   Depressed   Mood:   Depressed   Relating  Eye contact:   Normal   Facial expression:   Depressed   Attitude toward examiner:   Cooperative   Thought and Language  Speech flow:  Slow   Thought content:   Appropriate to Mood and Circumstances   Preoccupation:   None   Hallucinations:   None   Organization:  No data recorded  [employer] of Knowledge:   Average   Intelligence:   Average   Abstraction:   Normal   Judgement:   Impaired   Reality Testing:   Adequate   Insight:   Gaps   Decision Making:   Normal   Social Functioning  Social Maturity:   Responsible   Social Judgement:   Normal   Stress  Stressors:   Other (Comment) (Pt unable to identify)   Coping Ability:   Overwhelmed   Skill Deficits:   None   Supports:   Friends/Service system     Religion: Religion/Spirituality Are You A Religious Person?: No  Leisure/Recreation: Leisure / Recreation Do You Have Hobbies?: Yes Leisure and Hobbies: Horses  Exercise/Diet: Exercise/Diet Do You Exercise?: Yes What Type of Exercise Do You Do?: Run/Walk, Other (Comment) (Horseback riding) How Many Times a Week Do You  Exercise?: 4-5 times a week Have You Gained or Lost A Significant Amount of Weight in the Past Six Months?: No Do You Follow a Special Diet?: Yes Type of Diet: Keto Do You Have Any Trouble Sleeping?: Yes Explanation of Sleeping Difficulties: Decreased sleep   CCA Employment/Education Employment/Work Situation: Employment / Work Situation Employment Situation: Employed Work Stressors: Unknown Patient's Job has Been Impacted by Current Illness: No Has Patient ever Been in Equities trader?: No  Education: Education Is Patient Currently Attending School?: No Last Grade Completed: 16 Did You Product manager?: Yes What Type of College Degree Do you Have?: BA Did You Have An Individualized Education Program (IIEP): No Did You Have Any Difficulty At School?: No Patient's Education Has Been Impacted by Current Illness: No   CCA Family/Childhood History Family and  Relationship History: Family history Marital status: Single Does patient have children?: No  Childhood History:  Childhood History Did patient suffer any verbal/emotional/physical/sexual abuse as a child?: No Did patient suffer from severe childhood neglect?: No Has patient ever been sexually abused/assaulted/raped as an adolescent or adult?: No Was the patient ever a victim of a crime or a disaster?: No Witnessed domestic violence?: No Has patient been affected by domestic violence as an adult?: No  Child/Adolescent Assessment:     CCA Substance Use Alcohol/Drug Use: Alcohol / Drug Use Pain Medications: Pt took 2 tabs of Narco Prescriptions: Denies abuse Over the Counter: Denies abuse History of alcohol / drug use?: Yes Longest period of sobriety (when/how long): "A couple of days" Negative Consequences of Use:  (None) Withdrawal Symptoms: Blackouts, Tremors Substance #1 Name of Substance 1: Alcohol 1 - Age of First Use: Unknown 1 - Amount (size/oz): Large glass of liquor 1 - Frequency: Daily 1 - Duration:  Ongoing 1 - Last Use / Amount: 12/11/2020 1 - Method of Aquiring: Store 1- Route of Use: Drinking                       ASAM's:  Six Dimensions of Multidimensional Assessment  Dimension 1:  Acute Intoxication and/or Withdrawal Potential:   Dimension 1:  Description of individual's past and current experiences of substance use and withdrawal: Pt reports drinking large amounts of liquor daily  Dimension 2:  Biomedical Conditions and Complications:   Dimension 2:  Description of patient's biomedical conditions and  complications: Thyroid disease, HTN  Dimension 3:  Emotional, Behavioral, or Cognitive Conditions and Complications:  Dimension 3:  Description of emotional, behavioral, or cognitive conditions and complications: Depression with suicidal ideation  Dimension 4:  Readiness to Change:  Dimension 4:  Description of Readiness to Change criteria: Pt in contemplation stage  Dimension 5:  Relapse, Continued use, or Continued Problem Potential:  Dimension 5:  Relapse, continued use, or continued problem potential critiera description: Pt reports maintaining sobriety a couple of days  Dimension 6:  Recovery/Living Environment:  Dimension 6:  Recovery/Iiving environment criteria description: Pt lives alone  ASAM Severity Score: ASAM's Severity Rating Score: 12  ASAM Recommended Level of Treatment: ASAM Recommended Level of Treatment: Level III Residential Treatment   Substance use Disorder (SUD) Substance Use Disorder (SUD)  Checklist Symptoms of Substance Use: Continued use despite persistent or recurrent social, interpersonal problems, caused or exacerbated by use, Evidence of tolerance, Evidence of withdrawal (Comment), Presence of craving or strong urge to use, Substance(s) often taken in larger amounts or over longer times than was intended  Recommendations for Services/Supports/Treatments: Recommendations for Services/Supports/Treatments Recommendations For  Services/Supports/Treatments: Inpatient Hospitalization  DSM5 Diagnoses: Patient Active Problem List   Diagnosis Date Noted   Insomnia 10/03/2019   Hyperthyroidism 08/20/2019   Anxiety and depression 08/20/2019   HTN (hypertension) 02/03/2010    Patient Centered Plan: Patient is on the following Treatment Plan(s):  Depression and Substance Abuse   Referrals to Alternative Service(s): Referred to Alternative Service(s):   Place:   Date:   Time:    Referred to Alternative Service(s):   Place:   Date:   Time:    Referred to Alternative Service(s):   Place:   Date:   Time:    Referred to Alternative Service(s):   Place:   Date:   Time:     Pamalee Leyden, [facility]

## 2020-12-11 NOTE — ED Triage Notes (Signed)
IVC d/t SI with plan to shoot herself. Intoxication on [employer] arrival.

## 2020-12-11 NOTE — ED Notes (Signed)
Pt changed into purple scrubs , belongings stored in cabinet 19-22.

## 2020-12-11 NOTE — ED Provider Notes (Signed)
[facility]-EMERGENCY DEPT Provider Note   CSN: [id number] Arrival date & time: 12/11/20  1237     History Chief Complaint  Patient presents with   Psychiatric Evaluation    Carrie Beard is a 65 y.o. female with pertinent PMH of anxiety and depression that presents to the ER for sudicdial ideations. Pt brought in by [employer] after calling calling suicide hotline stating that she was going to commit suicide. .Pt states that she has a plan with  a gun, has access.  Also states that she has had multiple alcoholic drinks and took two Norco today. States " she does not want to be here" and she "has had enough." Denies any HI or other substance use. Has never acted on her depression until today.   HPI     Past Medical History:  Diagnosis Date   Allergy    Thyroid disease     Patient Active Problem List   Diagnosis Date Noted   Insomnia 10/03/2019   Hyperthyroidism 08/20/2019   Anxiety and depression 08/20/2019   HTN (hypertension) 02/03/2010    Past Surgical History:  Procedure Laterality Date   HERNIA REPAIR       OB History   No obstetric history on file.     Family History  Problem Relation Age of Onset   Hypertension Mother    Hypercholesterolemia Mother    Diabetes Father    Cancer Father        skin, prostate   Heart disease Father    Hyperlipidemia Father    Kidney disease Father    Diabetes Paternal Grandfather     Social History   Tobacco Use   Smoking status: Former    Types: Cigarettes    Quit date: 05/09/1998    Years since quitting: 22.6   Smokeless tobacco: Never  Vaping Use   Vaping Use: Never used  Substance Use Topics   Alcohol use: Yes    Alcohol/week: 0.0 standard drinks    Comment: social rare   Drug use: No    Home Medications Prior to Admission medications   Medication Sig Start Date End Date Taking? Authorizing Provider  desvenlafaxine (PRISTIQ) 50 MG 24 hr tablet TAKE ONE TABLET BY MOUTH DAILY 10/12/20   Lorre Munroe, NP  HYDROcodone-acetaminophen (NORCO/VICODIN) 5-325 MG tablet Take 1 tablet by mouth every 6 (six) hours as needed for moderate pain. 12/01/20   Lorre Munroe, NP  losartan (COZAAR) 50 MG tablet Take 1 tablet (50 mg total) by mouth daily. 07/28/20   Lorre Munroe, NP  Raspberry Ketones 100 MG CAPS Take by mouth.    [provider]  traZODone (DESYREL) 50 MG tablet Take 0.5-1 tablets (25-50 mg total) by mouth at bedtime as needed for sleep. 09/28/20   Lorre Munroe, NP    Allergies    Fire ant and Tramadol  Review of Systems   Review of Systems  Constitutional:  Negative for diaphoresis, fatigue and fever.  Eyes:  Negative for visual disturbance.  Respiratory:  Negative for shortness of breath.   Cardiovascular:  Negative for chest pain.  Gastrointestinal:  Negative for nausea and vomiting.  Musculoskeletal:  Negative for back pain and myalgias.  Skin:  Negative for color change, pallor, rash and wound.  Neurological:  Negative for syncope, weakness, light-headedness, numbness and headaches.  Psychiatric/Behavioral:  Positive for self-injury and suicidal ideas. Negative for behavioral problems and confusion.    Physical Exam Updated Vital Signs BP 127/77  Pulse 75   Temp 97.7 F (36.5 C)   Resp 16   Ht 5' (1.524 m)   Wt 52 kg   SpO2 100%   BMI 22.39 kg/m   Physical Exam Constitutional:      General: She is not in acute distress.    Appearance: Normal appearance. She is not ill-appearing, toxic-appearing or diaphoretic.  Cardiovascular:     Rate and Rhythm: Normal rate and regular rhythm.     Pulses: Normal pulses.  Pulmonary:     Effort: Pulmonary effort is normal.     Breath sounds: Normal breath sounds.  Musculoskeletal:        General: Normal range of motion.  Skin:    General: Skin is warm and dry.     Capillary Refill: Capillary refill takes less than 2 seconds.  Neurological:     General: No focal deficit present.     Mental Status: She  is alert and oriented to person, place, and time.  Psychiatric:        Attention and Perception: Attention normal. She is attentive. She does not perceive auditory or visual hallucinations.        Mood and Affect: Mood normal.        Speech: Speech normal.        Behavior: Behavior is cooperative.        Thought Content: Thought content includes suicidal ideation. Thought content does not include homicidal ideation. Thought content includes suicidal plan. Thought content does not include homicidal plan.    ED Results / Procedures / Treatments   Labs (all labs ordered are listed, but only abnormal results are displayed) Labs Reviewed  COMPREHENSIVE METABOLIC PANEL - Abnormal; Notable for the following components:      Result Value   BUN 24 (*)    Total Protein 9.0 (*)    Albumin 5.4 (*)    AST 45 (*)    Anion gap 16 (*)    All other components within normal limits  ETHANOL - Abnormal; Notable for the following components:   Alcohol, Ethyl (B) 237 (*)    All other components within normal limits  CBC WITH DIFFERENTIAL/PLATELET - Abnormal; Notable for the following components:   HCT 46.2 (*)    Abs Immature Granulocytes 0.10 (*)    All other components within normal limits  ACETAMINOPHEN LEVEL - Abnormal; Notable for the following components:   Acetaminophen (Tylenol), Serum <10 (*)    All other components within normal limits  SALICYLATE LEVEL - Abnormal; Notable for the following components:   Salicylate Lvl <7.0 (*)    All other components within normal limits  RESP PANEL BY RT-PCR (FLU A&B, COVID) ARPGX2  RAPID URINE DRUG SCREEN, HOSP PERFORMED    EKG None  Radiology No results found.  Procedures Procedures   Medications Ordered in ED Medications  LORazepam (ATIVAN) injection 1 mg (1 mg Intravenous Given 12/11/20 1548)    ED Course  I have reviewed the triage vital signs and the nursing notes.  Pertinent labs & imaging results that were available during my care  of the patient were reviewed by me and considered in my medical decision making (see chart for details).    MDM Rules/Calculators/A&P                           Carrie Beard is a 65 y.o. female with pertinent PMH of anxiety and depression that presents to the ER for  sudicdial ideations. Placed under IVC by [employer].Pt is cooperative, however becoming anxious about how long she is going to be here. Does not appear grossly intoxicated. Lab work grossly unremarkable, ehtanol elevated to 237.  Pt has been medically cleared, awaiting psyc consult. Pt is alert and able to see psych at this time.   Pt handed off to oncoming team during shift change. Pt has required ativan since pt is anxious.       Final Clinical Impression(s) / ED Diagnoses Final diagnoses:  Suicidal ideation  Alcohol abuse    Rx / DC Orders ED Discharge Orders     None        Farrel Gordon, PA-C 12/11/20 2152    Derwood Kaplan, MD 12/15/20 1329

## 2020-12-12 DIAGNOSIS — Z79899 Other long term (current) drug therapy: Secondary | ICD-10-CM | POA: Diagnosis not present

## 2020-12-12 DIAGNOSIS — F332 Major depressive disorder, recurrent severe without psychotic features: Secondary | ICD-10-CM | POA: Diagnosis not present

## 2020-12-12 DIAGNOSIS — Z20822 Contact with and (suspected) exposure to covid-19: Secondary | ICD-10-CM | POA: Diagnosis not present

## 2020-12-12 DIAGNOSIS — Z046 Encounter for general psychiatric examination, requested by authority: Secondary | ICD-10-CM | POA: Diagnosis not present

## 2020-12-12 DIAGNOSIS — F1020 Alcohol dependence, uncomplicated: Secondary | ICD-10-CM | POA: Diagnosis not present

## 2020-12-12 DIAGNOSIS — F1010 Alcohol abuse, uncomplicated: Secondary | ICD-10-CM | POA: Diagnosis not present

## 2020-12-12 DIAGNOSIS — Y907 Blood alcohol level of 200-239 mg/100 ml: Secondary | ICD-10-CM | POA: Diagnosis not present

## 2020-12-12 DIAGNOSIS — G4700 Insomnia, unspecified: Secondary | ICD-10-CM | POA: Diagnosis not present

## 2020-12-12 DIAGNOSIS — F419 Anxiety disorder, unspecified: Secondary | ICD-10-CM | POA: Diagnosis not present

## 2020-12-12 DIAGNOSIS — R451 Restlessness and agitation: Secondary | ICD-10-CM | POA: Diagnosis not present

## 2020-12-12 DIAGNOSIS — I10 Essential (primary) hypertension: Secondary | ICD-10-CM | POA: Diagnosis not present

## 2020-12-12 DIAGNOSIS — Z87891 Personal history of nicotine dependence: Secondary | ICD-10-CM | POA: Diagnosis not present

## 2020-12-12 DIAGNOSIS — R45851 Suicidal ideations: Secondary | ICD-10-CM | POA: Diagnosis not present

## 2020-12-12 MED ORDER — LOSARTAN POTASSIUM 50 MG PO TABS
50.0000 mg | ORAL_TABLET | Freq: Once | ORAL | Status: AC
  Administered 2020-12-12: 50 mg via ORAL
  Filled 2020-12-12: qty 1

## 2020-12-12 NOTE — ED Provider Notes (Signed)
Emergency Medicine Observation Re-evaluation Note  Carrie Beard is a 65 y.o. female, seen on rounds today.  Pt initially presented to the ED for complaints of Psychiatric Evaluation Currently, the patient is awake, alert, ambulatory, speaking clearly.  She states that she anticipated suicide hotline discussion not involuntary commitment and transport to hospital.  Physical Exam  BP (!) 166/105   Pulse 80   Temp 98 F (36.7 C)   Resp 16   Ht 5' (1.524 m)   Wt 52 kg   SpO2 100%   BMI 22.39 kg/m  Physical Exam General: No distress Cardiac: Regular rate and rhythm Lungs: No increased work of breathing Psych: Requesting assistance with suicidal thoughts  ED Course / MDM  EKG:   I have reviewed the labs performed to date as well as medications administered while in observation.  Recent changes in the last 24 hours include none.  Plan  Current plan is for behavioral health evaluation today.  Carrie Beard is not under involuntary commitment.     Gerhard Munch, MD 12/12/20 [phone]

## 2020-12-12 NOTE — Progress Notes (Signed)
Per Randa Evens, pt has been accepted to [facility] unit. Accepting provider is Dr. Beaulah Corin. Patient can arrive anytime. Number for report is [phone].  Crissie Reese, MSW, LCSW-A Phone: [phone] Disposition/TOC

## 2020-12-12 NOTE — ED Notes (Signed)
Pt DC d off unit to facility per provider. Pt alert, calm, cooperative, no s/s of distress.  DC information and belongings given to sheriff for transport. Pt ambulatory off unit, escorted and transported by sheriff.  

## 2020-12-12 NOTE — ED Provider Notes (Signed)
Patient accepted in transfer to [facility], Dr. Beaulah Corin accepting.   Gerhard Munch, MD 12/12/20 [phone]

## 2020-12-12 NOTE — Progress Notes (Signed)
Per Carrie Beard, patient meets criteria for inpatient treatment. There are no available or appropriate beds at [facility] today. CSW faxed referrals to the following facilities for review:  [facility]  Pending - Request Sent N/A [address] [place] [place] [address] [phone] --  [facility]  Pending - Request Sent N/A [address] [place] [address] [phone] --  [facility]  Pending - Request Sent N/A [address], [place] [address] [phone] --  [facility]  Pending - Request Sent N/A [address] [place] [address] [phone] --  [facility]  Pending - Request Sent N/A [address] [place] [address] [phone] --  [facility]  Pending - Request Sent N/A [address] [place], [place] [address] [phone] --  [facility]  Pending - Request Sent N/A [address] [place] [address] [phone] --  [facility]  Pending - Request Sent N/A [address] [place] [address] [phone] --  [facility]  Pending - Request Sent N/A [address] [place] [address] [phone] --  [facility]  Pending - Request Sent N/A [address], [place] [address] [phone] --  [facility]  Pending - Request Sent N/A [address] [place] [address] [phone] --  [facility]  Pending - Request Sent N/A [address] [place] [address] [phone] --  [facility]  Pending - Request Sent N/A [address] [place] [address] [phone] --  [facility]  Pending - Request Sent N/A [address], [place] [address] [phone] --  [facility]  Pending - Request Sent N/A [address] [place] [address] [phone] --  [facility]  Pending - Request Sent N/A [address] [place], [place] [address] [phone] --  [facility]  Pending - Request Sent N/A [address] [place] [address] [phone] --  [facility]  Pending - Request Sent N/A [address] [place] [address] [phone] --   TTS will continue to seek bed placement.  Crissie Reese, MSW, LCSW-A, LCAS-A Phone: [phone] Disposition/TOC

## 2020-12-17 DIAGNOSIS — F332 Major depressive disorder, recurrent severe without psychotic features: Secondary | ICD-10-CM | POA: Diagnosis not present

## 2020-12-17 DIAGNOSIS — F1020 Alcohol dependence, uncomplicated: Secondary | ICD-10-CM | POA: Diagnosis not present

## 2020-12-21 ENCOUNTER — Telehealth: Payer: Self-pay

## 2020-12-21 NOTE — Telephone Encounter (Signed)
The pt called requesting a letter stating she can return to work. She was taking out of work for Rehab to detox from alcohol. She is also requesting a prescription of Disulfiram //: Antabuse  to be sent over to [facility].

## 2020-12-21 NOTE — Telephone Encounter (Signed)
Copied from CRM [id number]. Topic: General - Other >> Dec 21, 2020  8:21 AM Jaquita Rector A wrote: Reason for CRM: Patient called in stated that she was in the hospital last week and need an appointment with Nicki Reaper today or tomorrow preferably today but nothing available till Thursday she refused asking for a call back today. Also asking if Rene Kocher can send an Rx to her pharmacy for Disulfiram //: Antabuse can be reached at Ph# [phone]

## 2020-12-22 ENCOUNTER — Encounter: Payer: Self-pay | Admitting: Internal Medicine

## 2020-12-22 ENCOUNTER — Other Ambulatory Visit: Payer: Self-pay

## 2020-12-22 ENCOUNTER — Ambulatory Visit: Payer: BC Managed Care – PPO | Admitting: Internal Medicine

## 2020-12-22 VITALS — BP 141/85 | HR 71 | Temp 97.9°F | Resp 17 | Ht 60.0 in | Wt 112.6 lb

## 2020-12-22 DIAGNOSIS — Z8659 Personal history of other mental and behavioral disorders: Secondary | ICD-10-CM

## 2020-12-22 DIAGNOSIS — F5101 Primary insomnia: Secondary | ICD-10-CM | POA: Diagnosis not present

## 2020-12-22 DIAGNOSIS — F1010 Alcohol abuse, uncomplicated: Secondary | ICD-10-CM | POA: Diagnosis not present

## 2020-12-22 DIAGNOSIS — F419 Anxiety disorder, unspecified: Secondary | ICD-10-CM

## 2020-12-22 DIAGNOSIS — F32A Depression, unspecified: Secondary | ICD-10-CM

## 2020-12-22 MED ORDER — DISULFIRAM 250 MG PO TABS: 250.0000 mg | ORAL_TABLET | Freq: Every day | ORAL | 0 refills | Status: DC

## 2020-12-22 NOTE — Progress Notes (Signed)
Subjective:    Patient ID: Carrie Beard, female    DOB: [birth year], 65 y.o.   MRN: [id number]  HPI  Patient presents to clinic today for ER follow-up.  She presented to the ER 8/5 IVC with complaint of alcohol abuse and SI.  She spent 5 days in medical detox.  She was discharged 8/11 and advised to follow-up with her PCP.  Since discharge, she reports she has been abstinent from alcohol.  She would like to return to work at this time.  She would also like a Rx for Antabuse if possible.  She has not attended [employer]  She will need a work note.  Review of Systems  Past Medical History:  Diagnosis Date   Allergy    Thyroid disease     Current Outpatient Medications  Medication Sig Dispense Refill   desvenlafaxine (PRISTIQ) 50 MG 24 hr tablet TAKE ONE TABLET BY MOUTH DAILY 90 tablet 3   HYDROcodone-acetaminophen (NORCO/VICODIN) 5-325 MG tablet Take 1 tablet by mouth every 6 (six) hours as needed for moderate pain. 30 tablet 0   losartan (COZAAR) 50 MG tablet Take 1 tablet (50 mg total) by mouth daily. 90 tablet 1   traZODone (DESYREL) 50 MG tablet Take 0.5-1 tablets (25-50 mg total) by mouth at bedtime as needed for sleep. 30 tablet 0   No current facility-administered medications for this visit.    Allergies  Allergen Reactions   Fire Ant    Tramadol Nausea And Vomiting    Family History  Problem Relation Age of Onset   Hypertension Mother    Hypercholesterolemia Mother    Diabetes Father    Cancer Father        skin, prostate   Heart disease Father    Hyperlipidemia Father    Kidney disease Father    Diabetes Paternal Grandfather     Social History   Socioeconomic History   Marital status: Single    Spouse name: Not on file   Number of children: 0   Years of education: Not on file   Highest education level: Not on file  Occupational History   Occupation: Insurance claims handler  Tobacco Use   Smoking status: Former    Types: Cigarettes    Quit date: 05/09/1998     Years since quitting: 22.6   Smokeless tobacco: Never  Vaping Use   Vaping Use: Never used  Substance and Sexual Activity   Alcohol use: Yes    Alcohol/week: 0.0 standard drinks    Comment: social rare   Drug use: No   Sexual activity: Not Currently    Birth control/protection: Abstinence  Other Topics Concern   Not on file  Social History Narrative   Single   No children   Rides horses   Social Determinants of Health   Financial Resource Strain: Not on file  Food Insecurity: Not on file  Transportation Needs: Not on file  Physical Activity: Not on file  Stress: Not on file  Social Connections: Not on file  Intimate Partner Violence: Not on file     Constitutional: Denies fever, malaise, fatigue, headache or abrupt weight changes.  Respiratory: Denies difficulty breathing, shortness of breath, cough or sputum production.   Cardiovascular: Denies chest pain, chest tightness, palpitations or swelling in the hands or feet.  Neurological: Patient reports insomnia.  Denies dizziness, difficulty with memory, difficulty with speech or problems with balance and coordination.  Psych: Patient has a history of anxiety and depression.  Denies  SI/HI.  No other specific complaints in a complete review of systems (except as listed in HPI above).     Objective:   Physical Exam  BP (!) 141/85 (BP Location: Right Arm, Patient Position: Sitting, Cuff Size: Normal)   Pulse 71   Temp 97.9 F (36.6 C) (Temporal)   Resp 17   Ht 5' (1.524 m)   Wt 112 lb 9.6 oz (51.1 kg)   SpO2 100%   BMI 21.99 kg/m   Wt Readings from Last 3 Encounters:  12/11/20 114 lb 10.2 oz (52 kg)  12/01/20 116 lb 3.2 oz (52.7 kg)  09/28/20 116 lb 6.4 oz (52.8 kg)    General: Appears her stated age, well developed, well nourished in NAD. Cardiovascular: Normal rate. Pulmonary/Chest: Normal effort. Neurological: Alert and oriented.  Psychiatric: Mood and affect normal. Behavior is normal. Judgment and  thought content normal.     BMET    Component Value Date/Time   NA 143 12/11/2020 1246   NA 140 10/14/2016 0844   K 4.3 12/11/2020 1246   CL 103 12/11/2020 1246   CO2 24 12/11/2020 1246   GLUCOSE 80 12/11/2020 1246   BUN 24 (H) 12/11/2020 1246   BUN 17 10/14/2016 0844   CREATININE 0.92 12/11/2020 1246   CREATININE 1.18 (H) 09/28/2020 1600   CALCIUM 9.4 12/11/2020 1246   GFRNONAA >60 12/11/2020 1246   GFRNONAA 71 10/12/2015 0946   GFRAA 69 10/14/2016 0844   GFRAA 82 10/12/2015 0946    Lipid Panel     Component Value Date/Time   CHOL 243 (H) 09/28/2020 1600   CHOL 220 (H) 10/14/2016 0844   TRIG 61 09/28/2020 1600   HDL 138 09/28/2020 1600   HDL 107 10/14/2016 0844   CHOLHDL 1.8 09/28/2020 1600   VLDL 14.0 09/19/2019 1538   LDLCALC 90 09/28/2020 1600    CBC    Component Value Date/Time   WBC 8.4 12/11/2020 1246   RBC 4.96 12/11/2020 1246   HGB 15.0 12/11/2020 1246   HGB 14.3 10/14/2016 0844   HCT 46.2 (H) 12/11/2020 1246   HCT 44.2 10/14/2016 0844   PLT 298 12/11/2020 1246   PLT 268 10/14/2016 0844   MCV 93.1 12/11/2020 1246   MCV 89 10/14/2016 0844   MCH 30.2 12/11/2020 1246   MCHC 32.5 12/11/2020 1246   RDW 14.5 12/11/2020 1246   RDW 14.3 10/14/2016 0844   LYMPHSABS 1.7 12/11/2020 1246   LYMPHSABS 1.8 10/14/2016 0844   MONOABS 0.4 12/11/2020 1246   EOSABS 0.0 12/11/2020 1246   EOSABS 0.1 10/14/2016 0844   BASOSABS 0.0 12/11/2020 1246   BASOSABS 0.0 10/14/2016 0844    Hgb A1C Lab Results  Component Value Date   HGBA1C 5.2 11/17/2012           Assessment & Plan:   ER follow-up for Alcohol Abuse and SI:  Encouraged her to remain abstinence from alcohol Rx for Antabuse 250 mg p.o. daily as needed She will continue Pristiq and Trazodone as prescribed Encouraged her to attend AA meetings Work note provided  Close follow-up recommended Nicki Reaper, NP This visit occurred during the SARS-CoV-2 public health emergency.  Safety protocols  were in place, including screening questions prior to the visit, additional usage of staff PPE, and extensive cleaning of exam room while observing appropriate contact time as indicated for disinfecting solutions.

## 2020-12-22 NOTE — Patient Instructions (Signed)
Alcohol Abuse and Dependence Information, Adult Alcohol is a widely available drug. People drink alcohol in different amounts. People who drink alcohol very often and in large amounts often have problems during and after drinking. They may develop what is called an alcohol use disorder. There are two main types of alcohol use disorders: Alcohol abuse. This is when you use alcohol too much or too often. You may use alcohol to make yourself feel happy or to reduce stress. You may have a hard time setting a limit on the amount you drink. Alcohol dependence. This is when you use alcohol consistently for a period of time, and your body changes as a result. This can make it hard to stop drinking because you may start to feel sick or feel different when you do not use alcohol. These symptoms are known as withdrawal. How can alcohol abuse and dependence affect me? Alcohol abuse and dependence can have a negative effect on your life. Drinking too much can lead to addiction. You may feel like you need alcohol to function normally. You may drink alcohol before work in the morning, during the day, or as soon as you get home from work in the evening. These actions can result in: Poor work performance. Job loss. Financial problems. Car crashes or criminal charges from driving after drinking alcohol. Problems in your relationships with friends and family. Losing the trust and respect of coworkers, friends, and family. Drinking heavily over a long period of time can permanently damage your body and brain, and can cause lifelong health issues, such as: Damage to your liver or pancreas. Heart problems, high blood pressure, or stroke. Certain cancers. Decreased ability to fight infections. Brain or nerve damage. Depression. Early (premature) death. If you are careless or you crave alcohol, it is easy to drink more than your body can handle (overdose). Alcohol overdose is a serious situation that requires  hospitalization. It may lead to permanent injuries or death. What can increase my risk? Having a family history of alcohol abuse. Having depression or other mental health conditions. Beginning to drink at an early age. Binge drinking often. Experiencing trauma, stress, and an unstable home life during childhood. Spending time with people who drink often. What actions can I take to prevent or manage alcohol abuse and dependence? Do not drink alcohol if: Your health care provider tells you not to drink. You are pregnant, may be pregnant, or are planning to become pregnant. If you drink alcohol: Limit how much you use to: 0-1 drink a day for women. 0-2 drinks a day for men. Be aware of how much alcohol is in your drink. In the [place] one drink equals one 12 oz bottle of beer (355 mL), one 5 oz glass of wine (148 mL), or one 1 oz glass of hard liquor (44 mL). Stop drinking if you have been drinking too much. This can be very hard to do if you are used to abusing alcohol. If you begin to have withdrawal symptoms, talk with your health care provider or a person that you trust. These symptoms may include anxiety, shaky hands, headache, nausea, sweating, or not being able to sleep. Choose to drink nonalcoholic beverages in social gatherings and places where there may be alcohol. Activity Spend more time on activities that you enjoy that do not involve alcohol, like hobbies or exercise. Find healthy ways to cope with stress, such as exercise, meditation, or spending time with people you care about. General information Talk to your family,   coworkers, and friends about supporting you in your efforts to stop drinking. If they drink, ask them not to drink around you. Spend more time with people who do not drink alcohol. If you think that you have an alcohol dependency problem: Tell friends or family about your concerns. Talk with your health care provider or another health professional about where to  get help. Work with a therapist and a chemical dependency counselor. Consider joining a support group for people who struggle with alcohol abuse and dependence. Where to find support  Your health care provider. SMART Recovery: www.smartrecovery.org Therapy and support groups Local treatment centers or chemical dependency counselors. Local AA groups in your community: www.aa.org Where to find more information Centers for Disease Control and Prevention: www.cdc.gov National Institute on Alcohol Abuse and Alcoholism: www.niaaa.nih.gov Alcoholics Anonymous (AA): www.aa.org Contact a health care provider if: You drank more or for longer than you intended on more than one occasion. You tried to stop drinking or to cut back on how much you drink, but you were not able to. You often drink to the point of vomiting or passing out. You want to drink so badly that you cannot think about anything else. You have problems in your life due to drinking, but you continue to drink. You keep drinking even though you feel anxious, depressed, or have experienced memory loss. You have stopped doing the things you used to enjoy in order to drink. You have to drink more than you used to in order to get the effect you want. You experience anxiety, sweating, nausea, shakiness, and trouble sleeping when you try to stop drinking. Get help right away if: You have thoughts about hurting yourself or others. You have serious withdrawal symptoms, including: Confusion. Racing heart. High blood pressure. Fever. If you ever feel like you may hurt yourself or others, or have thoughts about taking your own life, get help right away. You can go to your nearest emergency department or call: Your local emergency services (911 in the [place] A suicide crisis helpline, such as the National Suicide Prevention Lifeline at 1-800-273-8255. This is open 24 hours a day. Summary Alcohol abuse and dependence can have a negative  effect on your life. Drinking too much or too often can lead to addiction. If you drink alcohol, limit how much you use. If you are having trouble keeping your drinking under control, find ways to change your behavior. Hobbies, calming activities, exercise, or support groups can help. If you feel you need help with changing your drinking habits, talk with your health care provider, a good friend, or a therapist, or go to an AA group. This information is not intended to replace advice given to you by your health care provider. Make sure you discuss any questions you have with your health care provider. Document Revised: 08/14/2018 Document Reviewed: 07/03/2018 Elsevier Patient Education  2022 Elsevier Inc.  

## 2020-12-22 NOTE — Assessment & Plan Note (Signed)
Continue trazodone as prescribed

## 2020-12-22 NOTE — Assessment & Plan Note (Signed)
Continue Pristiq as prescribed Support offered

## 2020-12-22 NOTE — Telephone Encounter (Signed)
She has to be seen for hospital follow-up for these things.  Please have her schedule an appointment.

## 2020-12-23 ENCOUNTER — Telehealth: Payer: Self-pay | Admitting: Internal Medicine

## 2020-12-23 MED ORDER — METRONIDAZOLE 500 MG PO TABS: 500.0000 mg | ORAL_TABLET | Freq: Every day | ORAL | 0 refills | Status: DC

## 2020-12-23 NOTE — Addendum Note (Signed)
Addended by: Lorre Munroe on: 12/23/2020 12:16 PM   Modules accepted: Orders

## 2020-12-23 NOTE — Telephone Encounter (Signed)
RX for flagyl sent to pharmacy

## 2020-12-23 NOTE — Telephone Encounter (Signed)
The pt was notified that the prescription was sent over to her pharmacy.  °

## 2020-12-23 NOTE — Telephone Encounter (Signed)
Pt states the pharmacy had to order Rx  disulfiram (ANTABUSE) 250 MG tablet. And would be there on Friday She was told it "may not" be there. Pt states there was an alternate med you could prescribe.  She wants to know if you can go ahead and send Rx that in, just in case they can't get the other. She says she will only pick up one Rx.  [facility] [id number] - [place], [place] - [address]

## 2021-01-01 DIAGNOSIS — M25511 Pain in right shoulder: Secondary | ICD-10-CM | POA: Diagnosis not present

## 2021-01-01 DIAGNOSIS — S43101A Unspecified dislocation of right acromioclavicular joint, initial encounter: Secondary | ICD-10-CM | POA: Diagnosis not present

## 2021-01-01 DIAGNOSIS — G8918 Other acute postprocedural pain: Secondary | ICD-10-CM | POA: Diagnosis not present

## 2021-01-01 DIAGNOSIS — S4351XA Sprain of right acromioclavicular joint, initial encounter: Secondary | ICD-10-CM | POA: Diagnosis not present

## 2021-01-06 DIAGNOSIS — M25511 Pain in right shoulder: Secondary | ICD-10-CM | POA: Diagnosis not present

## 2021-01-07 ENCOUNTER — Emergency Department (HOSPITAL_COMMUNITY)
Admission: EM | Admit: 2021-01-07 | Discharge: 2021-01-08 | Disposition: A | Discharge status: Home / Self Care | Payer: BC Managed Care – PPO | Attending: Emergency Medicine | Admitting: Emergency Medicine

## 2021-01-07 ENCOUNTER — Emergency Department (HOSPITAL_COMMUNITY): Payer: BC Managed Care – PPO

## 2021-01-07 ENCOUNTER — Other Ambulatory Visit: Payer: Self-pay

## 2021-01-07 DIAGNOSIS — R45851 Suicidal ideations: Secondary | ICD-10-CM | POA: Diagnosis not present

## 2021-01-07 DIAGNOSIS — E119 Type 2 diabetes mellitus without complications: Secondary | ICD-10-CM | POA: Insufficient documentation

## 2021-01-07 DIAGNOSIS — Y908 Blood alcohol level of 240 mg/100 ml or more: Secondary | ICD-10-CM | POA: Diagnosis not present

## 2021-01-07 DIAGNOSIS — I10 Essential (primary) hypertension: Secondary | ICD-10-CM | POA: Insufficient documentation

## 2021-01-07 DIAGNOSIS — Z043 Encounter for examination and observation following other accident: Secondary | ICD-10-CM | POA: Diagnosis not present

## 2021-01-07 DIAGNOSIS — W108XXA Fall (on) (from) other stairs and steps, initial encounter: Secondary | ICD-10-CM | POA: Insufficient documentation

## 2021-01-07 DIAGNOSIS — M7989 Other specified soft tissue disorders: Secondary | ICD-10-CM | POA: Diagnosis not present

## 2021-01-07 DIAGNOSIS — Z20822 Contact with and (suspected) exposure to covid-19: Secondary | ICD-10-CM | POA: Diagnosis not present

## 2021-01-07 DIAGNOSIS — Z87891 Personal history of nicotine dependence: Secondary | ICD-10-CM | POA: Diagnosis not present

## 2021-01-07 DIAGNOSIS — R404 Transient alteration of awareness: Secondary | ICD-10-CM | POA: Diagnosis not present

## 2021-01-07 DIAGNOSIS — J3489 Other specified disorders of nose and nasal sinuses: Secondary | ICD-10-CM | POA: Diagnosis not present

## 2021-01-07 DIAGNOSIS — S022XXA Fracture of nasal bones, initial encounter for closed fracture: Secondary | ICD-10-CM | POA: Diagnosis not present

## 2021-01-07 DIAGNOSIS — F332 Major depressive disorder, recurrent severe without psychotic features: Secondary | ICD-10-CM | POA: Diagnosis not present

## 2021-01-07 DIAGNOSIS — F10129 Alcohol abuse with intoxication, unspecified: Secondary | ICD-10-CM | POA: Diagnosis not present

## 2021-01-07 DIAGNOSIS — S0992XA Unspecified injury of nose, initial encounter: Secondary | ICD-10-CM | POA: Diagnosis not present

## 2021-01-07 DIAGNOSIS — R079 Chest pain, unspecified: Secondary | ICD-10-CM | POA: Diagnosis not present

## 2021-01-07 DIAGNOSIS — F10220 Alcohol dependence with intoxication, uncomplicated: Secondary | ICD-10-CM | POA: Insufficient documentation

## 2021-01-07 DIAGNOSIS — S0181XA Laceration without foreign body of other part of head, initial encounter: Secondary | ICD-10-CM | POA: Insufficient documentation

## 2021-01-07 DIAGNOSIS — R000 Tachycardia, unspecified: Secondary | ICD-10-CM | POA: Diagnosis not present

## 2021-01-07 DIAGNOSIS — F10920 Alcohol use, unspecified with intoxication, uncomplicated: Secondary | ICD-10-CM

## 2021-01-07 DIAGNOSIS — S199XXA Unspecified injury of neck, initial encounter: Secondary | ICD-10-CM | POA: Diagnosis not present

## 2021-01-07 LAB — RESP PANEL BY RT-PCR (FLU A&B, COVID) ARPGX2
Influenza A by PCR: NEGATIVE
Influenza B by PCR: NEGATIVE
SARS Coronavirus 2 by RT PCR: NEGATIVE

## 2021-01-07 LAB — COMPREHENSIVE METABOLIC PANEL
ALT: 16 U/L (ref 0–44)
AST: 21 U/L (ref 15–41)
Albumin: 4 g/dL (ref 3.5–5.0)
Alkaline Phosphatase: 56 U/L (ref 38–126)
Anion gap: 16 — ABNORMAL HIGH (ref 5–15)
BUN: 8 mg/dL (ref 8–23)
CO2: 20 mmol/L — ABNORMAL LOW (ref 22–32)
Calcium: 9.3 mg/dL (ref 8.9–10.3)
Chloride: 105 mmol/L (ref 98–111)
Creatinine, Ser: 0.95 mg/dL (ref 0.44–1.00)
GFR, Estimated: >60 mL/min (ref >60)
Glucose, Bld: 136 mg/dL — ABNORMAL HIGH (ref 70–99)
Potassium: 3.4 mmol/L — ABNORMAL LOW (ref 3.5–5.1)
Sodium: 141 mmol/L (ref 135–145)
Total Bilirubin: 0.4 mg/dL (ref 0.3–1.2)
Total Protein: 7 g/dL (ref 6.5–8.1)

## 2021-01-07 LAB — URINALYSIS, ROUTINE W REFLEX MICROSCOPIC
Bacteria, UA: NONE SEEN
Bilirubin Urine: NEGATIVE
Glucose, UA: NEGATIVE mg/dL
Ketones, ur: NEGATIVE mg/dL
Nitrite: NEGATIVE
Protein, ur: NEGATIVE mg/dL
Specific Gravity, Urine: 1.004 — ABNORMAL LOW (ref 1.005–1.030)
pH: 6 (ref 5.0–8.0)

## 2021-01-07 LAB — CBG MONITORING, ED: Glucose-Capillary: 125 mg/dL — ABNORMAL HIGH (ref 70–99)

## 2021-01-07 LAB — RAPID URINE DRUG SCREEN, HOSP PERFORMED
Amphetamines: NOT DETECTED
Barbiturates: NOT DETECTED
Benzodiazepines: POSITIVE — AB
Cocaine: NOT DETECTED
Opiates: POSITIVE — AB
Tetrahydrocannabinol: NOT DETECTED

## 2021-01-07 LAB — CBC WITH DIFFERENTIAL/PLATELET
Abs Immature Granulocytes: 0.04 10*3/uL (ref 0.00–0.07)
Basophils Absolute: 0 10*3/uL (ref 0.0–0.1)
Basophils Relative: 0 %
Eosinophils Absolute: 0.1 10*3/uL (ref 0.0–0.5)
Eosinophils Relative: 1 %
HCT: 45.7 % (ref 36.0–46.0)
Hemoglobin: 15.3 g/dL — ABNORMAL HIGH (ref 12.0–15.0)
Immature Granulocytes: 0 %
Lymphocytes Relative: 23 %
Lymphs Abs: 2.3 10*3/uL (ref 0.7–4.0)
MCH: 30.6 pg (ref 26.0–34.0)
MCHC: 33.5 g/dL (ref 30.0–36.0)
MCV: 91.4 fL (ref 80.0–100.0)
Monocytes Absolute: 0.4 10*3/uL (ref 0.1–1.0)
Monocytes Relative: 4 %
Neutro Abs: 7.1 10*3/uL (ref 1.7–7.7)
Neutrophils Relative %: 72 %
Platelets: 352 10*3/uL (ref 150–400)
RBC: 5 MIL/uL (ref 3.87–5.11)
RDW: 12.5 % (ref 11.5–15.5)
WBC: 10.1 10*3/uL (ref 4.0–10.5)
nRBC: 0 % (ref 0.0–0.2)

## 2021-01-07 LAB — TSH: TSH: 0.685 u[IU]/mL (ref 0.350–4.500)

## 2021-01-07 LAB — ACETAMINOPHEN LEVEL: Acetaminophen (Tylenol), Serum: <10 ug/mL — ABNORMAL LOW (ref 10–30)

## 2021-01-07 LAB — ETHANOL: Alcohol, Ethyl (B): 265 mg/dL — ABNORMAL HIGH (ref <10)

## 2021-01-07 LAB — SALICYLATE LEVEL: Salicylate Lvl: <7 mg/dL — ABNORMAL LOW (ref 7.0–30.0)

## 2021-01-07 MED ORDER — THIAMINE HCL 100 MG PO TABS
100.0000 mg | ORAL_TABLET | Freq: Every day | ORAL | Status: DC
  Administered 2021-01-07 – 2021-01-08 (×2): 100 mg via ORAL
  Filled 2021-01-07 (×2): qty 1

## 2021-01-07 MED ORDER — LORAZEPAM 2 MG/ML IJ SOLN: 0.0000 mg | Freq: Two times a day (BID) | INTRAMUSCULAR | Status: DC

## 2021-01-07 MED ORDER — LORAZEPAM 2 MG/ML IJ SOLN: 0.0000 mg | Freq: Four times a day (QID) | INTRAMUSCULAR | Status: DC

## 2021-01-07 MED ORDER — MIDAZOLAM HCL 2 MG/2ML IJ SOLN
INTRAMUSCULAR | Status: AC
  Filled 2021-01-07: qty 2

## 2021-01-07 MED ORDER — ALUM & MAG HYDROXIDE-SIMETH 200-200-20 MG/5ML PO SUSP: 30.0000 mL | Freq: Four times a day (QID) | ORAL | Status: DC | PRN

## 2021-01-07 MED ORDER — LOSARTAN POTASSIUM 50 MG PO TABS
50.0000 mg | ORAL_TABLET | Freq: Every day | ORAL | Status: DC
  Administered 2021-01-07 – 2021-01-08 (×2): 50 mg via ORAL
  Filled 2021-01-07 (×2): qty 1

## 2021-01-07 MED ORDER — HYDROCODONE-ACETAMINOPHEN 5-325 MG PO TABS
1.0000 | ORAL_TABLET | Freq: Four times a day (QID) | ORAL | Status: DC | PRN
  Administered 2021-01-07 – 2021-01-08 (×3): 1 via ORAL
  Filled 2021-01-07 (×3): qty 1

## 2021-01-07 MED ORDER — SODIUM CHLORIDE 0.9 % IV BOLUS
1000.0000 mL | Freq: Once | INTRAVENOUS | Status: AC
  Administered 2021-01-07: 1000 mL via INTRAVENOUS

## 2021-01-07 MED ORDER — SODIUM CHLORIDE 0.9 % IV SOLN: INTRAVENOUS | Status: DC

## 2021-01-07 MED ORDER — LORAZEPAM 1 MG PO TABS: 0.0000 mg | ORAL_TABLET | Freq: Four times a day (QID) | ORAL | Status: DC

## 2021-01-07 MED ORDER — MIDAZOLAM HCL 2 MG/2ML IJ SOLN
2.0000 mg | Freq: Once | INTRAMUSCULAR | Status: AC
  Administered 2021-01-07: 2 mg via INTRAVENOUS

## 2021-01-07 MED ORDER — DESVENLAFAXINE SUCCINATE ER 50 MG PO TB24
50.0000 mg | ORAL_TABLET | Freq: Every day | ORAL | Status: DC
  Administered 2021-01-08: 50 mg via ORAL
  Filled 2021-01-07: qty 1

## 2021-01-07 MED ORDER — LORAZEPAM 1 MG PO TABS: 0.0000 mg | ORAL_TABLET | Freq: Two times a day (BID) | ORAL | Status: DC

## 2021-01-07 MED ORDER — TRAZODONE HCL 50 MG PO TABS
25.0000 mg | ORAL_TABLET | Freq: Every evening | ORAL | Status: DC | PRN
  Administered 2021-01-07: 50 mg via ORAL
  Filled 2021-01-07: qty 1

## 2021-01-07 MED ORDER — ONDANSETRON HCL 4 MG PO TABS: 4.0000 mg | ORAL_TABLET | Freq: Three times a day (TID) | ORAL | Status: DC | PRN

## 2021-01-07 MED ORDER — THIAMINE HCL 100 MG/ML IJ SOLN: 100.0000 mg | Freq: Every day | INTRAMUSCULAR | Status: DC

## 2021-01-07 NOTE — ED Notes (Signed)
Erskine Squibb (mother) [phone]

## 2021-01-07 NOTE — ED Provider Notes (Signed)
[facility] EMERGENCY DEPARTMENT Provider Note   CSN: [id number] Arrival date & time: 01/07/21  1733     History No chief complaint on file.   Carrie Beard is a 65 y.o. female.  Pt presents to the ED today with a fall and SI.  Pt was at home with her family.  She went upstairs and family heard a thump.  She was found on the ground with a small lac to her head.  She was found with a suicide note by her.  She is not answering questions.  She continuously says "I don't want to live anymore" and "I can'[t do this."  The pt does have a hx of depression and SI and alcohol abuse.  She went to [facility] on 8/6 for SI.      Past Medical History:  Diagnosis Date   Allergy    Thyroid disease     Patient Active Problem List   Diagnosis Date Noted   Insomnia 10/03/2019   Hyperthyroidism 08/20/2019   Anxiety and depression 08/20/2019   HTN (hypertension) 02/03/2010    Past Surgical History:  Procedure Laterality Date   HERNIA REPAIR       OB History   No obstetric history on file.     Family History  Problem Relation Age of Onset   Hypertension Mother    Hypercholesterolemia Mother    Diabetes Father    Cancer Father        skin, prostate   Heart disease Father    Hyperlipidemia Father    Kidney disease Father    Diabetes Paternal Grandfather     Social History   Tobacco Use   Smoking status: Former    Types: Cigarettes    Quit date: 05/09/1998    Years since quitting: 22.6   Smokeless tobacco: Never  Vaping Use   Vaping Use: Never used  Substance Use Topics   Alcohol use: Not Currently    Comment: social rare   Drug use: No    Home Medications Prior to Admission medications   Medication Sig Start Date End Date Taking? Authorizing Provider  desvenlafaxine (PRISTIQ) 50 MG 24 hr tablet TAKE ONE TABLET BY MOUTH DAILY 10/12/20   Lorre Munroe, NP  HYDROcodone-acetaminophen (NORCO/VICODIN) 5-325 MG tablet Take 1 tablet by mouth every 6  (six) hours as needed for moderate pain. 12/01/20   Lorre Munroe, NP  losartan (COZAAR) 50 MG tablet Take 1 tablet (50 mg total) by mouth daily. 07/28/20   Lorre Munroe, NP  metroNIDAZOLE (FLAGYL) 500 MG tablet Take 1 tablet (500 mg total) by mouth daily. One po bid x 7 days 12/23/20   Lorre Munroe, NP  traZODone (DESYREL) 50 MG tablet Take 0.5-1 tablets (25-50 mg total) by mouth at bedtime as needed for sleep. 09/28/20   Lorre Munroe, NP    Allergies    Fire ant and Tramadol  Review of Systems   Review of Systems  Psychiatric/Behavioral:  Positive for suicidal ideas.   All other systems reviewed and are negative.  Physical Exam Updated Vital Signs BP 123/78   Pulse 80   Temp 98 F (36.7 C) (Oral)   Resp 16   SpO2 99%   Physical Exam Vitals and nursing note reviewed.  HENT:     Head: Normocephalic.     Comments: 3 [place] to face.      Right Ear: External ear normal.     Left Ear: External  ear normal.     Mouth/Throat:     Mouth: Mucous membranes are moist.     Pharynx: Oropharynx is clear.  Eyes:     Extraocular Movements: Extraocular movements intact.     Conjunctiva/sclera: Conjunctivae normal.     Pupils: Pupils are equal, round, and reactive to light.  Cardiovascular:     Rate and Rhythm: Normal rate and regular rhythm.     Pulses: Normal pulses.     Heart sounds: Normal heart sounds.  Pulmonary:     Effort: Pulmonary effort is normal.     Breath sounds: Normal breath sounds.  Abdominal:     General: Abdomen is flat. Bowel sounds are normal.     Palpations: Abdomen is soft.  Musculoskeletal:     Cervical back: Normal range of motion and neck supple.     Comments: Right shoulder in an immobilizer (recent surgery)  Skin:    General: Skin is warm.     Capillary Refill: Capillary refill takes less than 2 seconds.  Neurological:     Mental Status: She is alert.     Comments: Pt not answering questions, but is moving all 4 extremities  Psychiatric:         Mood and Affect: Mood is depressed. Affect is labile and tearful.        Thought Content: Thought content includes suicidal ideation.    ED Results / Procedures / Treatments   Labs (all labs ordered are listed, but only abnormal results are displayed) Labs Reviewed  COMPREHENSIVE METABOLIC PANEL - Abnormal; Notable for the following components:      Result Value   Potassium 3.4 (*)    CO2 20 (*)    Glucose, Bld 136 (*)    Anion gap 16 (*)    All other components within normal limits  SALICYLATE LEVEL - Abnormal; Notable for the following components:   Salicylate Lvl <7.0 (*)    All other components within normal limits  ACETAMINOPHEN LEVEL - Abnormal; Notable for the following components:   Acetaminophen (Tylenol), Serum <10 (*)    All other components within normal limits  ETHANOL - Abnormal; Notable for the following components:   Alcohol, Ethyl (B) 265 (*)    All other components within normal limits  RAPID URINE DRUG SCREEN, HOSP PERFORMED - Abnormal; Notable for the following components:   Opiates POSITIVE (*)    Benzodiazepines POSITIVE (*)    All other components within normal limits  CBC WITH DIFFERENTIAL/PLATELET - Abnormal; Notable for the following components:   Hemoglobin 15.3 (*)    All other components within normal limits  URINALYSIS, ROUTINE W REFLEX MICROSCOPIC - Abnormal; Notable for the following components:   Color, Urine STRAW (*)    Specific Gravity, Urine 1.004 (*)    Hgb urine dipstick SMALL (*)    Leukocytes,Ua TRACE (*)    All other components within normal limits  CBG MONITORING, ED - Abnormal; Notable for the following components:   Glucose-Capillary 125 (*)    All other components within normal limits  RESP PANEL BY RT-PCR (FLU A&B, COVID) [id number]  TSH    EKG EKG Interpretation  Date/Time:  Thursday January 07 2021 17:43:59 EDT Ventricular Rate:  91 PR Interval:  126 QRS Duration: 96 QT Interval:  380 QTC Calculation: 468 R  Axis:   83 Text Interpretation: Sinus rhythm Probable left atrial enlargement Borderline right axis deviation Confirmed by Jacalyn Lefevre [phone]) on 01/07/2021 6:05:39 PM  Radiology CT HEAD WO  CONTRAST  Result Date: 01/07/2021 CLINICAL DATA:  Mixed trauma. EXAM: CT HEAD WITHOUT CONTRAST CT MAXILLOFACIAL WITHOUT CONTRAST CT CERVICAL SPINE WITHOUT CONTRAST TECHNIQUE: Multidetector CT imaging of the head, cervical spine, and maxillofacial structures were performed using the standard protocol without intravenous contrast. Multiplanar CT image reconstructions of the cervical spine and maxillofacial structures were also generated. COMPARISON:  None. FINDINGS: CT HEAD FINDINGS Brain: The ventricles and sulci appropriate size for patient's age. The gray-white matter discrimination is preserved. There is no acute intracranial hemorrhage. No mass effect or midline shift. No extra-axial fluid collection. Vascular: No hyperdense vessel or unexpected calcification. Skull: Normal. Negative for fracture or focal lesion. Other: Mild mucoperiosteal thickening of paranasal sinuses no air-fluid level. The mastoid air cells are clear. CT MAXILLOFACIAL FINDINGS Osseous: Minimally depressed fracture of the left nasal bone. No other acute fracture. No mandibular subluxation. Orbits: The globes and retro-orbital fat are preserved. Sinuses: There is mild mucoperiosteal thickening of paranasal sinuses. No air-fluid Soft tissues: There is mild soft tissue swelling over the nose. No large hematoma. CT CERVICAL SPINE FINDINGS Alignment: No acute subluxation. Skull base and vertebrae: No acute fracture. Soft tissues and spinal canal: No prevertebral fluid or swelling. No visible canal hematoma. Disc levels:  Multilevel degenerative changes Upper chest: Negative. Other: Bilateral carotid bulb calcified plaques. IMPRESSION: 1. No acute intracranial pathology. 2. No acute/traumatic cervical spine pathology. 3. Minimally depressed fracture of  the left nasal bone. Electronically Signed   By: Elgie Collard M.D.   On: 01/07/2021 20:19   CT Cervical Spine Wo Contrast  Result Date: 01/07/2021 CLINICAL DATA:  Mixed trauma. EXAM: CT HEAD WITHOUT CONTRAST CT MAXILLOFACIAL WITHOUT CONTRAST CT CERVICAL SPINE WITHOUT CONTRAST TECHNIQUE: Multidetector CT imaging of the head, cervical spine, and maxillofacial structures were performed using the standard protocol without intravenous contrast. Multiplanar CT image reconstructions of the cervical spine and maxillofacial structures were also generated. COMPARISON:  None. FINDINGS: CT HEAD FINDINGS Brain: The ventricles and sulci appropriate size for patient's age. The gray-white matter discrimination is preserved. There is no acute intracranial hemorrhage. No mass effect or midline shift. No extra-axial fluid collection. Vascular: No hyperdense vessel or unexpected calcification. Skull: Normal. Negative for fracture or focal lesion. Other: Mild mucoperiosteal thickening of paranasal sinuses no air-fluid level. The mastoid air cells are clear. CT MAXILLOFACIAL FINDINGS Osseous: Minimally depressed fracture of the left nasal bone. No other acute fracture. No mandibular subluxation. Orbits: The globes and retro-orbital fat are preserved. Sinuses: There is mild mucoperiosteal thickening of paranasal sinuses. No air-fluid Soft tissues: There is mild soft tissue swelling over the nose. No large hematoma. CT CERVICAL SPINE FINDINGS Alignment: No acute subluxation. Skull base and vertebrae: No acute fracture. Soft tissues and spinal canal: No prevertebral fluid or swelling. No visible canal hematoma. Disc levels:  Multilevel degenerative changes Upper chest: Negative. Other: Bilateral carotid bulb calcified plaques. IMPRESSION: 1. No acute intracranial pathology. 2. No acute/traumatic cervical spine pathology. 3. Minimally depressed fracture of the left nasal bone. Electronically Signed   By: Elgie Collard M.D.   On:  01/07/2021 20:19   DG Pelvis Portable  Result Date: 01/07/2021 CLINICAL DATA:  Status post fall. EXAM: PORTABLE PELVIS 1-2 VIEWS COMPARISON:  November 08, 2012 FINDINGS: There is no evidence of pelvic fracture or diastasis. No pelvic bone lesions are seen. IMPRESSION: Negative. Electronically Signed   By: Aram Candela M.D.   On: 01/07/2021 19:33   DG Chest Port 1 View  Result Date: 01/07/2021 CLINICAL DATA:  Fall,  Chest pain EXAM: PORTABLE CHEST 1 VIEW COMPARISON:  None. FINDINGS: The heart size and mediastinal contours are within normal limits. Atherosclerotic calcification of the aortic knob. No focal airspace consolidation, pleural effusion, or pneumothorax. Postsurgical changes at the right coracoid and distal clavicle. There is overlying soft tissue swelling with a small amount of air located within the soft tissues superior to the right clavicle. IMPRESSION: 1. No acute cardiopulmonary findings. 2. Soft tissue swelling with a small amount of air located within the soft tissues superior to the right clavicle. Findings may represent postoperative changes in the setting of a recent surgery. Changes related to trauma/laceration or possibly infection could have a similar imaging appearance. Correlate with patient history and physical exam. Electronically Signed   By: Duanne Guess D.O.   On: 01/07/2021 19:34   CT Maxillofacial Wo Contrast  Result Date: 01/07/2021 CLINICAL DATA:  Mixed trauma. EXAM: CT HEAD WITHOUT CONTRAST CT MAXILLOFACIAL WITHOUT CONTRAST CT CERVICAL SPINE WITHOUT CONTRAST TECHNIQUE: Multidetector CT imaging of the head, cervical spine, and maxillofacial structures were performed using the standard protocol without intravenous contrast. Multiplanar CT image reconstructions of the cervical spine and maxillofacial structures were also generated. COMPARISON:  None. FINDINGS: CT HEAD FINDINGS Brain: The ventricles and sulci appropriate size for patient's age. The gray-white matter  discrimination is preserved. There is no acute intracranial hemorrhage. No mass effect or midline shift. No extra-axial fluid collection. Vascular: No hyperdense vessel or unexpected calcification. Skull: Normal. Negative for fracture or focal lesion. Other: Mild mucoperiosteal thickening of paranasal sinuses no air-fluid level. The mastoid air cells are clear. CT MAXILLOFACIAL FINDINGS Osseous: Minimally depressed fracture of the left nasal bone. No other acute fracture. No mandibular subluxation. Orbits: The globes and retro-orbital fat are preserved. Sinuses: There is mild mucoperiosteal thickening of paranasal sinuses. No air-fluid Soft tissues: There is mild soft tissue swelling over the nose. No large hematoma. CT CERVICAL SPINE FINDINGS Alignment: No acute subluxation. Skull base and vertebrae: No acute fracture. Soft tissues and spinal canal: No prevertebral fluid or swelling. No visible canal hematoma. Disc levels:  Multilevel degenerative changes Upper chest: Negative. Other: Bilateral carotid bulb calcified plaques. IMPRESSION: 1. No acute intracranial pathology. 2. No acute/traumatic cervical spine pathology. 3. Minimally depressed fracture of the left nasal bone. Electronically Signed   By: Elgie Collard M.D.   On: 01/07/2021 20:19    Procedures .Marland KitchenLaceration Repair  Date/Time: 01/07/2021 9:13 PM Performed by: Jacalyn Lefevre, MD Authorized by: Jacalyn Lefevre, MD   Consent:    Consent obtained:  Verbal Universal protocol:    Procedure explained and questions answered to patient or proxy's satisfaction: yes     Patient identity confirmed:  Verbally with patient Anesthesia:    Anesthesia method:  None Laceration details:    Location:  Face   Face location:  Forehead   Length (cm):  0.1 Treatment:    Area cleansed with:  Saline Skin repair:    Repair method:  Tissue adhesive Approximation:    Approximation:  Close Repair type:    Repair type:  Simple Post-procedure details:     Procedure completion:  Tolerated well, no immediate complications   Medications Ordered in ED Medications  sodium chloride 0.9 % bolus 1,000 mL (0 mLs Intravenous Stopped 01/07/21 2115)    And  0.9 %  sodium chloride infusion ( Intravenous New Bag/Given 01/07/21 1753)  ondansetron (ZOFRAN) tablet 4 mg (has no administration in time range)  alum & mag hydroxide-simeth (MAALOX/MYLANTA) 200-200-20 MG/5ML suspension 30 mL (  has no administration in time range)  desvenlafaxine (PRISTIQ) 24 hr tablet 50 mg (has no administration in time range)  HYDROcodone-acetaminophen (NORCO/VICODIN) 5-325 MG per tablet 1 tablet (has no administration in time range)  losartan (COZAAR) tablet 50 mg (has no administration in time range)  traZODone (DESYREL) tablet 25-50 mg (has no administration in time range)  midazolam (VERSED) injection 2 mg (2 mg Intravenous Given 01/07/21 1750)    ED Course  I have reviewed the triage vital signs and the nursing notes.  Pertinent labs & imaging results that were available during my care of the patient were reviewed by me and considered in my medical decision making (see chart for details).    MDM Rules/Calculators/A&P                           UDS + for opiates and benzos. We gave pt versed and pt has been taking pain meds for her shoulder.  Etoh is elevated.  Mom verifies that the pill count in correct in her pain med bottles.  Pt is more awake and cooperative now.    Pt is medically clear for TTS consult.  CIWA protocol started due to hx of etoh abuse.  She will need to f/u with ENT as an outpatient for her nasal fx.  Final Clinical Impression(s) / ED Diagnoses Final diagnoses:  Alcoholic intoxication without complication (HCC)  Suicidal ideation  Closed fracture of nasal bone, initial encounter    Rx / DC Orders ED Discharge Orders     None        Jacalyn LefevreHaviland, Travion Ke, MD 01/07/21 2117

## 2021-01-07 NOTE — ED Notes (Signed)
Pt belongings inventoried and (1 necklace in red bag) placed in locker #14 in [facility].

## 2021-01-07 NOTE — ED Triage Notes (Signed)
Pt from home via EMS-family heard a thump upstairs and found that she had crawled into her room, shut the door, and had bleeding from her head and had a suicide note beside her. Denies drug use or suicide attempt. Continually saying "I dont know" to questions while crying uncontrollably and stating that she "cannot do this" and "cannot live anymore."

## 2021-01-07 NOTE — ED Notes (Signed)
Patient transported to CT 

## 2021-01-08 ENCOUNTER — Telehealth (HOSPITAL_COMMUNITY): Payer: Self-pay | Admitting: Psychiatry

## 2021-01-08 NOTE — BH Assessment (Addendum)
Comprehensive Clinical Assessment (CCA) Note  01/08/2021 Carrie Beard [id number]  Disposition Update: Per Carrie Nixon, NP, patient is psych cleared. She does not meet criteria for inpatient psychiatric treatment. Obtained collateral information from patient's mother Carrie Beard) [phone], who agrees that it is safe  for patient to return home. Also, indicated that she didn't feel that going into an inpatient setting was a good idea for patient.   Mom will pick patient up at 1:15pm from the ED. Recommendations added to patient's AVS for her to follow up with the CDIOP program/ [facility] outpatient located on [facility].  Carrie Beard requested to follow up with patient regarding the CDIOP program.   Chief Complaint:  Chief Complaint  Patient presents with   Alcohol Intoxication   Psychiatric Evaluation   Visit Diagnosis: F33.2 Major depressive disorder, Recurrent episode, Severe F10.20 Alcohol use disorder, Severe  Carrie Beard is a 65 y.o. female that present to [facility]. States, "I did something really stupid", "I am a recovering alcoholic", "I relapsed on alcohol after being sober since  August 5th", "I feel that after not drinking alcohol so long, restarting the alcohol use, led to this incident". Patient reporting feelings of guilt stating, "I just keep making the wrong decisions over and over again, but I am willing to get help".   Patient started drinking at the age of 65 yrs old, "off and on". Patient reports that her average use of alcohol is 4-5 weeks. However, stopped using August 5. Last use was last night consuming several shots. She indicates having an issue with relapsing multiple times over the years.   History of falling down and causing injurious to herself,  when under the influence of alcohol.  She fell last night leading to observed facial bruises, black and blue bruises on face. Also, another fall leading to a shoulder injury/surgery, July 2022.   Denies history of  seizures and DT's. Denies current history and any other symptoms as it relates to withdrawal symptoms. She denies a family history of substance use. Patient with a history of alcohol detox at [facility], x5 days. Patient prescribed MAT to assist with her alcohol use at discharge from the facility. However, never started taking the medication. She also doesn't recall the name of the medication.   Current depressive symptoms: anger and irritability when under the influence and guilt. No reported issues with anxiety. No family history of mental health illness.   No change in appetite and/or sleeps. States that she sleeps between 6 & 8 hours per night. Appetite is fair. No significant weight loss and/or gain.   Patient denies current suicidal ideations. Denies a history of suicide attempts and/or gestures. Denies history of self-mutilating behaviors. Patient was found with a suicide note yesterday, per chart review.   However, patient denies and says that she was putting her feelings on paper. Patient however, does not recall what she wrote in that note. Upon chart review during her arrival to the ED: " She continuously says "I don't want to live anymore" and "I can't do this."    Pt is dressed in hospital scrubs, alert and oriented to person, place. Pt speaks in a soft tone, at moderate volume and slow pace. Motor behavior appears normal. Eye contact is good. Pt's mood is depressed and affect is congruent with mood. Thought process is coherent at times and tangential at other times. There is no indication Pt is currently responding to internal stimuli or experiencing delusional thought content. She was  generally cooperative during assessment.   CCA Screening, Triage and Referral (STR)  Patient Reported Information How did you hear about us? Other (Comment) Mudlogger(Law enforcement)  What Is the Reason for Your Visit/Call Today? Pt presents to the ED today with a fall and SI.  Pt was at home with her  family.  She went upstairs and family heard a thump.  She was found on the ground with a small lac to her head.  She was found with a suicide note by her.  She is not answering questions.  She continuously says "I don't want to live anymore" and "I can'(t do this."  The pt does have a hx of depression and SI and alcohol abuse.  She went to [facility] on [facility] for SI.  How Long Has This Been Causing You Problems? 1 wk - 1 month  What Do You Feel Would Help You the Most Today? Treatment for Depression or other mood problem   Have You Recently Had Any Thoughts About Hurting Yourself? No  Are You Planning to Commit Suicide/Harm Yourself At This time? No   Have you Recently Had Thoughts About Hurting Someone Carrie Ohslse? No  Are You Planning to Harm Someone at This Time? No  Explanation: No data recorded  Have You Used Any Alcohol or Drugs in the Past 24 Hours? No  How Long Ago Did You Use Drugs or Alcohol? No data recorded What Did You Use and How Much? Unknown quantity of alcohol and took two tabs of Narco   Do You Currently Have a Therapist/Psychiatrist? No  Name of Therapist/Psychiatrist: No data recorded  Have You Been Recently Discharged From Any Office Practice or Programs? No  Explanation of Discharge From Practice/Program: No data recorded    CCA Screening Triage Referral Assessment Type of Contact: Tele-Assessment  Telemedicine Service Delivery:   Is this Initial or Reassessment? Initial Assessment  Date Telepsych consult ordered in [facility]:  01/11/21  Time Telepsych consult ordered in [facility]:  1620  Location of Assessment: [facility]  [facility]   Collateral Involvement: None   Does Patient Have a Court Appointed Legal Guardian? No data recorded Name and Contact of Legal Guardian: No data recorded If Minor and Not Living with Parent(s), Who has Custody? NA  Is CPS involved or ever been involved? Never  Is APS involved or ever been  involved? Never   Patient Determined To Be At Risk for Harm To Self or Others Based on Review of Patient Reported Information or Presenting Complaint? No  Method: No data recorded Availability of Means: No data recorded Intent: No data recorded Notification Required: No data recorded Additional Information for Danger to Others Potential: No data recorded Additional Comments for Danger to Others Potential: No data recorded Are There Guns or Other Weapons in Your Home? No data recorded Types of Guns/Weapons: No data recorded Are These Weapons Safely Secured?                            No data recorded Who Could Verify You Are Able To Have These Secured: No data recorded Do You Have any Outstanding Charges, Pending Court Dates, Parole/Probation? No data recorded Contacted To Inform of Risk of Harm To Self or Others: Unable to Contact:    Does Patient Present under Involuntary Commitment? Yes  IVC Papers Initial File Date: 01/08/21   [place]temporarily living in [place] with mother due to  should injuiry; permement home [place] [facility])   Patient Currently Receiving the Following Services: Not Receiving Services   Determination of Need: Emergent (2 hours)   Options For Referral: Chemical Dependency Intensive Outpatient Therapy (CDIOP); Medication Management     CCA Biopsychosocial Patient Reported Schizophrenia/Schizoaffective Diagnosis in Past: No   Strengths: Pt works and has good support system   Mental Health Symptoms Depression:   Change in energy/activity; Difficulty Concentrating; Fatigue; Hopelessness; Irritability; Tearfulness; Worthlessness   Duration of Depressive symptoms:    Mania:   None   Anxiety:    Worrying; Tension   Psychosis:   None   Duration of Psychotic symptoms:    Trauma:   None   Obsessions:   None   Compulsions:   None   Inattention:   N/A   Hyperactivity/Impulsivity:   N/A    Oppositional/Defiant Behaviors:   N/A   Emotional Irregularity:   None   Other Mood/Personality Symptoms:   None    Mental Status Exam Appearance and self-care  Stature:   Small   Weight:   Average weight   Clothing:   -- (Scrubs)   Grooming:   Normal   Cosmetic use:   Age appropriate   Posture/gait:   Normal   Motor activity:   Not Remarkable   Sensorium  Attention:   Distractible   Concentration:   Variable   Orientation:   X5   Recall/memory:   Normal   Affect and Mood  Affect:   Depressed   Mood:   Depressed   Relating  Eye contact:   Normal   Facial expression:   Depressed   Attitude toward examiner:   Cooperative   Thought and Language  Speech flow:  Slow   Thought content:   Appropriate to Mood and Circumstances   Preoccupation:   None   Hallucinations:   None   Organization:  No data recorded  [employer] of Knowledge:   Average   Intelligence:   Average   Abstraction:   Normal   Judgement:   Impaired   Reality Testing:   Adequate   Insight:   Gaps   Decision Making:   Normal   Social Functioning  Social Maturity:   Responsible   Social Judgement:   Normal   Stress  Stressors:   Other (Comment)   Coping Ability:   Overwhelmed   Skill Deficits:   None   Supports:   Friends/Service system     Religion: Religion/Spirituality Are You A Religious Person?: No  Leisure/Recreation: Leisure / Recreation Do You Have Hobbies?: Yes Leisure and Hobbies: Horses  Exercise/Diet: Exercise/Diet Do You Exercise?: Yes What Type of Exercise Do You Do?: Run/Walk, Other (Comment) How Many Times a Week Do You Exercise?: 4-5 times a week Do You Follow a Special Diet?: Yes Type of Diet: Keto Do You Have Any Trouble Sleeping?: Yes Explanation of Sleeping Difficulties: Decreased sleep   CCA Employment/Education Employment/Work Situation: Employment / Work Situation Employment  Situation: Employed Work Stressors: Unknown Patient's Job has Been Impacted by Current Illness: No Has Patient ever Been in Equities trader?: No  Education: Education Is Patient Currently Attending School?: No Last Grade Completed: 16 Did You Product manager?: Yes What Type of College Degree Do you Have?: BA Did You Have An Individualized Education Program (IIEP): No Did You Have Any Difficulty At School?: No Patient's Education Has Been Impacted by Current Illness: No   CCA Family/Childhood History Family and Relationship History:  Family history Marital status: Single Does patient have children?: No  Childhood History:  Childhood History By whom was/is the patient raised?: Both parents Did patient suffer any verbal/emotional/physical/sexual abuse as a child?: No Did patient suffer from severe childhood neglect?: No Has patient ever been sexually abused/assaulted/raped as an adolescent or adult?: No Was the patient ever a victim of a crime or a disaster?: No Witnessed domestic violence?: No Has patient been affected by domestic violence as an adult?: No  Child/Adolescent Assessment:     CCA Substance Use Alcohol/Drug Use: Alcohol / Drug Use Pain Medications: Pt took 2 tabs of Narco Prescriptions: Denies abuse Over the Counter: Denies abuse History of alcohol / drug use?: Yes Longest period of sobriety (when/how long): "A couple of days" Withdrawal Symptoms: Blackouts, Tremors Substance #1 Name of Substance 1: Alcohol 1 - Age of First Use: Unknown 1 - Amount (size/oz): Large glass of liquor 1 - Frequency: Daily 1 - Duration: Ongoing 1 - Last Use / Amount: 01/07/2021; binged on alcohol 1 - Method of Aquiring: Store                       ASAM's:  Six Dimensions of Multidimensional Assessment  Dimension 1:  Acute Intoxication and/or Withdrawal Potential:   Dimension 1:  Description of individual's past and current experiences of substance use and withdrawal:  Pt reports drinking large amounts of liquor daily  Dimension 2:  Biomedical Conditions and Complications:   Dimension 2:  Description of patient's biomedical conditions and  complications: Thyroid disease, HTN  Dimension 3:  Emotional, Behavioral, or Cognitive Conditions and Complications:  Dimension 3:  Description of emotional, behavioral, or cognitive conditions and complications: Depression with suicidal ideation  Dimension 4:  Readiness to Change:     Dimension 5:  Relapse, Continued use, or Continued Problem Potential:  Dimension 5:  Relapse, continued use, or continued problem potential critiera description: Pt reports maintaining sobriety a couple of days  Dimension 6:  Recovery/Living Environment:  Dimension 6:  Recovery/Iiving environment criteria description: Pt lives alone  ASAM Severity Score: ASAM's Severity Rating Score: 12  ASAM Recommended Level of Treatment: ASAM Recommended Level of Treatment: Level III Residential Treatment   Substance use Disorder (SUD) Substance Use Disorder (SUD)  Checklist Symptoms of Substance Use: Continued use despite persistent or recurrent social, interpersonal problems, caused or exacerbated by use, Evidence of tolerance, Evidence of withdrawal (Comment), Presence of craving or strong urge to use, Substance(s) often taken in larger amounts or over longer times than was intended  Recommendations for Services/Supports/Treatments: Recommendations for Services/Supports/Treatments Recommendations For Services/Supports/Treatments: CD-IOP Intensive Chemical Dependency Program, Medication Management, Peer Support, Peer Support Services, Residential-Level 1, SAIOP (Substance Abuse Intensive Outpatient Program), Detox  Discharge Disposition:    DSM5 Diagnoses: Patient Active Problem List   Diagnosis Date Noted   Insomnia 10/03/2019   Hyperthyroidism 08/20/2019   Anxiety and depression 08/20/2019   HTN (hypertension) 02/03/2010     Referrals to  Alternative Service(s): Referred to Alternative Service(s):   Place:   Date:   Time:    Referred to Alternative Service(s):   Place:   Date:   Time:    Referred to Alternative Service(s):   Place:   Date:   Time:    Referred to Alternative Service(s):   Place:   Date:   Time:     Melynda Ripple, Counselor

## 2021-01-08 NOTE — ED Provider Notes (Signed)
Patient has been evaluated by psychiatry team also spoke to the patient's family members.  Patient mother is here to pick her up.  She was felt to be reasonably safe for discharge with outpatient follow-up.  The patient does not endorse any further active SI at this time in the ED, appears to be clinically sober for discharge.   Terald Sleeper, MD 01/08/21 1426

## 2021-01-08 NOTE — BH Assessment (Addendum)
Disposition Update:   Per Liborio Nixon, NP, patient is psych cleared. She does not meet criteria for inpatient psychiatric treatment. Obtained collateral information from patient's mother [name]) [phone], who agrees that it is safe  for patient to return home. Also, indicated that she didn't feel that going into an inpatient setting was a good idea for patient.   Mom will pick patient up at 1:15pm and also plans to bring a change of clothing.    Recommendations added to patient's AVS for her to follow up with the CDIOP program/ [facility] outpatient located on [facility]. Also, contact the case manager of the CDIOP program Jeri Modena) to make aware of patient's interest in the program. Please let me know if you have any questions and/or concerns.  CCA to be completed with details as noted above.

## 2021-01-08 NOTE — BH Assessment (Signed)
@  1104, requested patient's nurse Dois Davenport, NP, to move TTS cart to patient's room for TTS to complete patients initial TTS assessment.

## 2021-01-08 NOTE — Telephone Encounter (Signed)
D:  [facility] [facility]) referred pt to CD-IOP.  A:  Placed call to orient pt, and to inform her that someone will call to provide her with an appointment on 01-12-21.  Pt states she will need an evening program instead of afternoon.  Provided pt with [facility] information.  Inform [place], LCAS.  R:  Patient receptive.

## 2021-01-08 NOTE — ED Notes (Signed)
Pharmacy notified and aware of Pristiq to come from pharmacy

## 2021-01-13 DIAGNOSIS — M25511 Pain in right shoulder: Secondary | ICD-10-CM | POA: Diagnosis not present

## 2021-01-14 DIAGNOSIS — M25511 Pain in right shoulder: Secondary | ICD-10-CM | POA: Diagnosis not present

## 2021-01-20 DIAGNOSIS — M25511 Pain in right shoulder: Secondary | ICD-10-CM | POA: Diagnosis not present

## 2021-01-26 DIAGNOSIS — M25511 Pain in right shoulder: Secondary | ICD-10-CM | POA: Diagnosis not present

## 2021-01-28 DIAGNOSIS — M25511 Pain in right shoulder: Secondary | ICD-10-CM | POA: Diagnosis not present

## 2021-02-02 DIAGNOSIS — M25511 Pain in right shoulder: Secondary | ICD-10-CM | POA: Diagnosis not present

## 2021-02-05 DIAGNOSIS — M25511 Pain in right shoulder: Secondary | ICD-10-CM | POA: Diagnosis not present

## 2021-02-06 ENCOUNTER — Other Ambulatory Visit: Payer: Self-pay | Admitting: Internal Medicine

## 2021-02-06 DIAGNOSIS — I10 Essential (primary) hypertension: Secondary | ICD-10-CM

## 2021-02-06 NOTE — Telephone Encounter (Signed)
Requested Prescriptions  Pending Prescriptions Disp Refills  . losartan (COZAAR) 50 MG tablet [Pharmacy Med Name: LOSARTAN POTASSIUM 50 MG TAB] 90 tablet 1    Sig: TAKE ONE TABLET BY MOUTH DAILY     Cardiovascular:  Angiotensin Receptor Blockers Failed - 02/06/2021  8:15 AM      Failed - K in normal range and within 180 days    Potassium  Date Value Ref Range Status  01/07/2021 3.4 (L) 3.5 - 5.1 mmol/L Final         Failed - Last BP in normal range    BP Readings from Last 1 Encounters:  01/08/21 (!) 145/87         Passed - Cr in normal range and within 180 days    Creat  Date Value Ref Range Status  09/28/2020 1.18 (H) 0.50 - 0.99 mg/dL Final    Comment:    For patients >39 years of age, the reference limit for Creatinine is approximately 13% higher for people identified as African-American. .    Creatinine, Ser  Date Value Ref Range Status  01/07/2021 0.95 0.44 - 1.00 mg/dL Final         Passed - Patient is not pregnant      Passed - Valid encounter within last 6 months    Recent Outpatient Visits          1 month ago Alcohol abuse   [facility] [place], [place] W, NP   2 months ago Pain and swelling of shoulder, right   [facility] [place], Salvadore Oxford, NP   4 months ago Encounter for general adult medical examination with abnormal findings   [facility] [place], Salvadore Oxford, NP   4 years ago Annual physical exam   [facility], Dema Severin, PA-C   5 years ago Annual physical exam   [facility], Dema Severin, PA-C

## 2021-02-11 DIAGNOSIS — M25511 Pain in right shoulder: Secondary | ICD-10-CM | POA: Diagnosis not present

## 2021-02-15 DIAGNOSIS — M25511 Pain in right shoulder: Secondary | ICD-10-CM | POA: Diagnosis not present

## 2021-02-19 DIAGNOSIS — M25511 Pain in right shoulder: Secondary | ICD-10-CM | POA: Diagnosis not present

## 2021-02-20 ENCOUNTER — Other Ambulatory Visit: Payer: Self-pay | Admitting: Internal Medicine

## 2021-02-20 DIAGNOSIS — Z1231 Encounter for screening mammogram for malignant neoplasm of breast: Secondary | ICD-10-CM

## 2021-02-23 DIAGNOSIS — M25511 Pain in right shoulder: Secondary | ICD-10-CM | POA: Diagnosis not present

## 2021-02-24 ENCOUNTER — Other Ambulatory Visit (HOSPITAL_COMMUNITY): Payer: Self-pay

## 2021-02-24 MED ORDER — FLUARIX QUADRIVALENT 0.5 ML IM SUSY
PREFILLED_SYRINGE | INTRAMUSCULAR | 0 refills | Status: DC
  Filled 2021-02-24: qty 0.5, 1d supply, fill #0

## 2021-03-03 ENCOUNTER — Other Ambulatory Visit: Payer: Self-pay | Admitting: Internal Medicine

## 2021-03-03 DIAGNOSIS — M25511 Pain in right shoulder: Secondary | ICD-10-CM | POA: Diagnosis not present

## 2021-03-03 NOTE — Telephone Encounter (Signed)
Requested Prescriptions  Pending Prescriptions Disp Refills  . traZODone (DESYREL) 50 MG tablet [Pharmacy Med Name: traZODone 50 MG TABLET] 30 tablet 0    Sig: TAKE 1/2 TO 1 TABLET BY MOUTH EVERY NIGHT AT BEDTIME  AS NEEDED FOR SLEEP     Psychiatry: Antidepressants - Serotonin Modulator Passed - 03/03/2021  6:39 AM      Passed - Completed PHQ-2 or PHQ-9 in the last 360 days      Passed - Valid encounter within last 6 months    Recent Outpatient Visits          2 months ago Alcohol abuse   [facility] [place], [place] W, NP   3 months ago Pain and swelling of shoulder, right   [facility] [place], Salvadore Oxford, NP   5 months ago Encounter for general adult medical examination with abnormal findings   [facility] [place], Salvadore Oxford, NP   4 years ago Annual physical exam   [facility], Dema Severin, PA-C   5 years ago Annual physical exam   [facility], Dema Severin, PA-C

## 2021-03-10 DIAGNOSIS — M25511 Pain in right shoulder: Secondary | ICD-10-CM | POA: Diagnosis not present

## 2021-03-17 DIAGNOSIS — M25511 Pain in right shoulder: Secondary | ICD-10-CM | POA: Diagnosis not present

## 2021-03-22 DIAGNOSIS — M25511 Pain in right shoulder: Secondary | ICD-10-CM | POA: Diagnosis not present

## 2021-03-24 DIAGNOSIS — M25511 Pain in right shoulder: Secondary | ICD-10-CM | POA: Diagnosis not present

## 2021-03-29 ENCOUNTER — Other Ambulatory Visit: Payer: Self-pay | Admitting: Internal Medicine

## 2021-03-30 DIAGNOSIS — M25511 Pain in right shoulder: Secondary | ICD-10-CM | POA: Diagnosis not present

## 2021-03-30 NOTE — Telephone Encounter (Signed)
Requested Prescriptions  Pending Prescriptions Disp Refills  . traZODone (DESYREL) 50 MG tablet [Pharmacy Med Name: traZODone 50 MG TABLET] 30 tablet 0    Sig: TAKE 1/2 TO 1 TABLET BY MOUTH EVERY NIGHT AT BEDTIME AS NEEDED FOR SLEEP     Psychiatry: Antidepressants - Serotonin Modulator Passed - 03/29/2021  4:47 PM      Passed - Completed PHQ-2 or PHQ-9 in the last 360 days      Passed - Valid encounter within last 6 months    Recent Outpatient Visits          3 months ago Alcohol abuse   [facility] [place], [place] W, NP   3 months ago Pain and swelling of shoulder, right   [facility] [place], Salvadore Oxford, NP   6 months ago Encounter for general adult medical examination with abnormal findings   [facility] [place], Salvadore Oxford, NP   4 years ago Annual physical exam   [facility], Dema Severin, PA-C   5 years ago Annual physical exam   [facility], Dema Severin, PA-C

## 2021-04-07 DIAGNOSIS — M25511 Pain in right shoulder: Secondary | ICD-10-CM | POA: Diagnosis not present

## 2021-04-21 DIAGNOSIS — M25511 Pain in right shoulder: Secondary | ICD-10-CM | POA: Diagnosis not present

## 2021-04-28 ENCOUNTER — Other Ambulatory Visit: Payer: Self-pay | Admitting: Internal Medicine

## 2021-04-28 NOTE — Telephone Encounter (Signed)
Requested Prescriptions  Pending Prescriptions Disp Refills   traZODone (DESYREL) 50 MG tablet [Pharmacy Med Name: traZODone 50 MG TABLET] 90 tablet 0    Sig: TAKE 1/2 TO 1 TABLET BY MOUTH EVERY NIGHT AT BEDTIME AS NEEDED FOR SLEEP     Psychiatry: Antidepressants - Serotonin Modulator Passed - 04/28/2021  1:39 PM      Passed - Completed PHQ-2 or PHQ-9 in the last 360 days      Passed - Valid encounter within last 6 months    Recent Outpatient Visits          4 months ago Alcohol abuse   [facility] [place], [place] W, NP   4 months ago Pain and swelling of shoulder, right   [facility] [place], Salvadore Oxford, NP   7 months ago Encounter for general adult medical examination with abnormal findings   [facility] [place], Salvadore Oxford, NP   4 years ago Annual physical exam   [facility], Dema Severin, PA-C   5 years ago Annual physical exam   [facility], Dema Severin, PA-C

## 2021-05-05 DIAGNOSIS — M25511 Pain in right shoulder: Secondary | ICD-10-CM | POA: Diagnosis not present

## 2021-07-20 ENCOUNTER — Other Ambulatory Visit: Payer: Self-pay | Admitting: Internal Medicine

## 2021-07-20 DIAGNOSIS — I10 Essential (primary) hypertension: Secondary | ICD-10-CM

## 2021-07-21 NOTE — Telephone Encounter (Signed)
Courtesy refill. ?Requested Prescriptions  ?Pending Prescriptions Disp Refills  ?? losartan (COZAAR) 50 MG tablet [Pharmacy Med Name: LOSARTAN POTASSIUM 50 MG TAB] 30 tablet 0  ?  Sig: TAKE ONE TABLET BY MOUTH DAILY  ?  ? Cardiovascular:  Angiotensin Receptor Blockers Failed - 07/20/2021  7:54 PM  ?  ?  Failed - Cr in normal range and within 180 days  ?  Creat  ?Date Value Ref Range Status  ?09/28/2020 1.18 (H) 0.50 - 0.99 mg/dL Final  ?  Comment:  ?  For patients >31 years of age, the reference limit ?for Creatinine is approximately 13% higher for people ?identified as African-American. ?. ?  ? ?Creatinine, Ser  ?Date Value Ref Range Status  ?01/07/2021 0.95 0.44 - 1.00 mg/dL Final  ?   ?  ?  Failed - K in normal range and within 180 days  ?  Potassium  ?Date Value Ref Range Status  ?01/07/2021 3.4 (L) 3.5 - 5.1 mmol/L Final  ?   ?  ?  Failed - Last BP in normal range  ?  BP Readings from Last 1 Encounters:  ?01/08/21 (!) 145/87  ?   ?  ?  Failed - Valid encounter within last 6 months  ?  Recent Outpatient Visits   ?      ? 7 months ago Alcohol abuse  ? [facility] [place], [place] W, NP  ? 7 months ago Pain and swelling of shoulder, right  ? [facility] [place], Coralie Keens, NP  ? 9 months ago Encounter for general adult medical examination with abnormal findings  ? [facility] [place], Coralie Keens, NP  ? 4 years ago Annual physical exam  ? [facility] [place], PA-C  ? 5 years ago Annual physical exam  ? [facility] [place], PA-C  ?  ?  ? ?  ?  ?  Passed - Patient is not pregnant  ?  ?  ? ?

## 2021-09-16 ENCOUNTER — Other Ambulatory Visit: Payer: Self-pay | Admitting: Internal Medicine

## 2021-09-16 DIAGNOSIS — I10 Essential (primary) hypertension: Secondary | ICD-10-CM

## 2021-09-16 NOTE — Telephone Encounter (Signed)
Requested medication (s) are due for refill today - yes ? ?Requested medication (s) are on the active medication list -yes ? ?Future visit scheduled -no ? ?Last refill: 07/21/21 #30 ? ?Notes to clinic: Attempted to call patient to schedule appointment- no answer and mailbox full- unable to leave call back message. Courtesy RF has already been given- request sent to office for review  ? ?Requested Prescriptions  ?Pending Prescriptions Disp Refills  ? losartan (COZAAR) 50 MG tablet [Pharmacy Med Name: LOSARTAN POTASSIUM 50 MG TAB] 30 tablet 0  ?  Sig: TAKE ONE TABLET BY MOUTH DAILY  ?  ? Cardiovascular:  Angiotensin Receptor Blockers Failed - 09/16/2021 11:33 AM  ?  ?  Failed - Cr in normal range and within 180 days  ?  Creat  ?Date Value Ref Range Status  ?09/28/2020 1.18 (H) 0.50 - 0.99 mg/dL Final  ?  Comment:  ?  For patients >4 years of age, the reference limit ?for Creatinine is approximately 13% higher for people ?identified as African-American. ?. ?  ? ?Creatinine, Ser  ?Date Value Ref Range Status  ?01/07/2021 0.95 0.44 - 1.00 mg/dL Final  ?   ?  ?  Failed - K in normal range and within 180 days  ?  Potassium  ?Date Value Ref Range Status  ?01/07/2021 3.4 (L) 3.5 - 5.1 mmol/L Final  ?   ?  ?  Failed - Last BP in normal range  ?  BP Readings from Last 1 Encounters:  ?01/08/21 (!) 145/87  ?   ?  ?  Failed - Valid encounter within last 6 months  ?  Recent Outpatient Visits   ? ?      ? 8 months ago Alcohol abuse  ? [facility] [place], [place] W, NP  ? 9 months ago Pain and swelling of shoulder, right  ? [facility] [place], Salvadore Oxford, NP  ? 11 months ago Encounter for general adult medical examination with abnormal findings  ? [facility] [place], Salvadore Oxford, NP  ? 4 years ago Annual physical exam  ? [facility], Dema Severin, PA-C  ? 5 years ago Annual physical exam  ? [facility], Dema Severin, PA-C  ? ?  ?  ? ? ?  ?  ?  Passed - Patient is not  pregnant  ?  ?  ? ? ? ?Requested Prescriptions  ?Pending Prescriptions Disp Refills  ? losartan (COZAAR) 50 MG tablet [Pharmacy Med Name: LOSARTAN POTASSIUM 50 MG TAB] 30 tablet 0  ?  Sig: TAKE ONE TABLET BY MOUTH DAILY  ?  ? Cardiovascular:  Angiotensin Receptor Blockers Failed - 09/16/2021 11:33 AM  ?  ?  Failed - Cr in normal range and within 180 days  ?  Creat  ?Date Value Ref Range Status  ?09/28/2020 1.18 (H) 0.50 - 0.99 mg/dL Final  ?  Comment:  ?  For patients >25 years of age, the reference limit ?for Creatinine is approximately 13% higher for people ?identified as African-American. ?. ?  ? ?Creatinine, Ser  ?Date Value Ref Range Status  ?01/07/2021 0.95 0.44 - 1.00 mg/dL Final  ?   ?  ?  Failed - K in normal range and within 180 days  ?  Potassium  ?Date Value Ref Range Status  ?01/07/2021 3.4 (L) 3.5 - 5.1 mmol/L Final  ?   ?  ?  Failed - Last BP in normal  range  ?  BP Readings from Last 1 Encounters:  ?01/08/21 (!) 145/87  ?   ?  ?  Failed - Valid encounter within last 6 months  ?  Recent Outpatient Visits   ? ?      ? 8 months ago Alcohol abuse  ? [facility] [place], [place] W, NP  ? 9 months ago Pain and swelling of shoulder, right  ? [facility] [place], Salvadore Oxford, NP  ? 11 months ago Encounter for general adult medical examination with abnormal findings  ? [facility] [place], Salvadore Oxford, NP  ? 4 years ago Annual physical exam  ? [facility], Dema Severin, PA-C  ? 5 years ago Annual physical exam  ? [facility], Dema Severin, PA-C  ? ?  ?  ? ? ?  ?  ?  Passed - Patient is not pregnant  ?  ?  ? ? ? ?

## 2021-10-12 ENCOUNTER — Other Ambulatory Visit: Payer: Self-pay | Admitting: Internal Medicine

## 2021-10-12 DIAGNOSIS — I10 Essential (primary) hypertension: Secondary | ICD-10-CM

## 2021-10-12 NOTE — Telephone Encounter (Signed)
Requested medication (s) are due for refill today:   Yes  Requested medication (s) are on the active medication list:   Yes  Future visit scheduled:   Yes 10/21/2021   Last ordered: 09/16/2021 #30, 0 refills  Returned because labs due per protocol and courtesy refill was given in May.  Provider to review for refill prior to appt.   Requested Prescriptions  Pending Prescriptions Disp Refills   losartan (COZAAR) 50 MG tablet [Pharmacy Med Name: LOSARTAN POTASSIUM 50 MG TAB] 30 tablet 0    Sig: TAKE ONE TABLET BY MOUTH DAILY     Cardiovascular:  Angiotensin Receptor Blockers Failed - 10/12/2021  6:22 AM      Failed - Cr in normal range and within 180 days    Creat  Date Value Ref Range Status  09/28/2020 1.18 (H) 0.50 - 0.99 mg/dL Final    Comment:    For patients >70 years of age, the reference limit for Creatinine is approximately 13% higher for people identified as African-American. .    Creatinine, Ser  Date Value Ref Range Status  01/07/2021 0.95 0.44 - 1.00 mg/dL Final         Failed - K in normal range and within 180 days    Potassium  Date Value Ref Range Status  01/07/2021 3.4 (L) 3.5 - 5.1 mmol/L Final         Failed - Last BP in normal range    BP Readings from Last 1 Encounters:  01/08/21 (!) 145/87         Failed - Valid encounter within last 6 months    Recent Outpatient Visits           9 months ago Alcohol abuse   [facility] [place], [place], NP   10 months ago Pain and swelling of shoulder, right   [facility] [place], Salvadore Oxford, NP   1 year ago Encounter for general adult medical examination with abnormal findings   [facility] [place], Salvadore Oxford, NP   4 years ago Annual physical exam   [facility], Dema Severin, PA-C   6 years ago Annual physical exam   [facility], Dema Severin, PA-C       Future Appointments             In 1 week Sampson Si, Salvadore Oxford, NP [facility]             Passed - Patient is not pregnant

## 2021-10-18 ENCOUNTER — Other Ambulatory Visit: Payer: Self-pay | Admitting: Internal Medicine

## 2021-10-19 NOTE — Telephone Encounter (Signed)
Requested medication (s) are due for refill today -expired Rx  Requested medication (s) are on the active medication list -yes  Future visit scheduled -yes  Last refill: 10/12/20 #90 3RF  Notes to clinic: expired Rx, fails lab protocol  Requested Prescriptions  Pending Prescriptions Disp Refills   desvenlafaxine (PRISTIQ) 50 MG 24 hr tablet [Pharmacy Med Name: DESVENLAFAXINE SUCCNT ER 50 MG] 90 tablet 3    Sig: TAKE ONE TABLET BY MOUTH DAILY     Psychiatry: Antidepressants - SNRI - desvenlafaxine & venlafaxine Failed - 10/18/2021  6:23 AM      Failed - Last BP in normal range    BP Readings from Last 1 Encounters:  01/08/21 (!) 145/87         Failed - Valid encounter within last 6 months    Recent Outpatient Visits           10 months ago Alcohol abuse   [facility] [place], [place] W, NP   10 months ago Pain and swelling of shoulder, right   [facility] [place], Salvadore Oxford, NP   1 year ago Encounter for general adult medical examination with abnormal findings   [facility] [place], Salvadore Oxford, NP   5 years ago Annual physical exam   [facility], Dema Severin, PA-C   6 years ago Annual physical exam   [facility], Dema Severin, PA-C       Future Appointments             In 2 days Lorre Munroe, NP [facility]            Failed - Lipid Panel in normal range within the last 12 months    Cholesterol, Total  Date Value Ref Range Status  10/14/2016 220 (H) 100 - 199 mg/dL Final   Cholesterol  Date Value Ref Range Status  09/28/2020 243 (H) <200 mg/dL Final   LDL Cholesterol (Calc)  Date Value Ref Range Status  09/28/2020 90 mg/dL (calc) Final    Comment:    Reference range: <100 . Desirable range <100 mg/dL for primary prevention;   <70 mg/dL for patients with CHD or diabetic patients  with > or = 2 CHD risk factors. Marland Kitchen LDL-C is now calculated using the Martin-Hopkins   calculation, which is a validated novel method providing  better accuracy than the Friedewald equation in the  estimation of LDL-C.  Horald Pollen et al. Lenox Ahr. 8115;726(20): 2061-2068  (http://education.[facility].com/faq/FAQ164)    HDL  Date Value Ref Range Status  09/28/2020 138 > OR = 50 mg/dL Final  35/59/7416 384 >53 mg/dL Final   Triglycerides  Date Value Ref Range Status  09/28/2020 61 <150 mg/dL Final         Passed - Cr in normal range and within 360 days    Creat  Date Value Ref Range Status  09/28/2020 1.18 (H) 0.50 - 0.99 mg/dL Final    Comment:    For patients >83 years of age, the reference limit for Creatinine is approximately 13% higher for people identified as African-American. .    Creatinine, Ser  Date Value Ref Range Status  01/07/2021 0.95 0.44 - 1.00 mg/dL Final         Passed - Completed PHQ-2 or PHQ-9 in the last 360 days         Requested Prescriptions  Pending Prescriptions Disp Refills   desvenlafaxine (PRISTIQ) 50  MG 24 hr tablet [Pharmacy Med Name: DESVENLAFAXINE SUCCNT ER 50 MG] 90 tablet 3    Sig: TAKE ONE TABLET BY MOUTH DAILY     Psychiatry: Antidepressants - SNRI - desvenlafaxine & venlafaxine Failed - 10/18/2021  6:23 AM      Failed - Last BP in normal range    BP Readings from Last 1 Encounters:  01/08/21 (!) 145/87         Failed - Valid encounter within last 6 months    Recent Outpatient Visits           10 months ago Alcohol abuse   [facility] [place], [place] W, NP   10 months ago Pain and swelling of shoulder, right   [facility] [place], Salvadore Oxford, NP   1 year ago Encounter for general adult medical examination with abnormal findings   [facility] [place], Salvadore Oxford, NP   5 years ago Annual physical exam   [facility], Dema Severin, PA-C   6 years ago Annual physical exam   [facility], Dema Severin, PA-C       Future Appointments              In 2 days Lorre Munroe, NP [facility]            Failed - Lipid Panel in normal range within the last 12 months    Cholesterol, Total  Date Value Ref Range Status  10/14/2016 220 (H) 100 - 199 mg/dL Final   Cholesterol  Date Value Ref Range Status  09/28/2020 243 (H) <200 mg/dL Final   LDL Cholesterol (Calc)  Date Value Ref Range Status  09/28/2020 90 mg/dL (calc) Final    Comment:    Reference range: <100 . Desirable range <100 mg/dL for primary prevention;   <70 mg/dL for patients with CHD or diabetic patients  with > or = 2 CHD risk factors. Marland Kitchen LDL-C is now calculated using the Martin-Hopkins  calculation, which is a validated novel method providing  better accuracy than the Friedewald equation in the  estimation of LDL-C.  Horald Pollen et al. Lenox Ahr. 4008;676(19): 2061-2068  (http://education.[facility].com/faq/FAQ164)    HDL  Date Value Ref Range Status  09/28/2020 138 > OR = 50 mg/dL Final  50/93/2671 245 >80 mg/dL Final   Triglycerides  Date Value Ref Range Status  09/28/2020 61 <150 mg/dL Final         Passed - Cr in normal range and within 360 days    Creat  Date Value Ref Range Status  09/28/2020 1.18 (H) 0.50 - 0.99 mg/dL Final    Comment:    For patients >49 years of age, the reference limit for Creatinine is approximately 13% higher for people identified as African-American. .    Creatinine, Ser  Date Value Ref Range Status  01/07/2021 0.95 0.44 - 1.00 mg/dL Final         Passed - Completed PHQ-2 or PHQ-9 in the last 360 days

## 2021-10-21 ENCOUNTER — Encounter: Payer: Self-pay | Admitting: Internal Medicine

## 2021-10-21 ENCOUNTER — Ambulatory Visit (INDEPENDENT_AMBULATORY_CARE_PROVIDER_SITE_OTHER): Payer: BC Managed Care – PPO | Admitting: Internal Medicine

## 2021-10-21 ENCOUNTER — Other Ambulatory Visit (HOSPITAL_COMMUNITY)
Admission: RE | Admit: 2021-10-21 | Discharge: 2021-10-21 | Disposition: A | Discharge status: Home / Self Care | Payer: BC Managed Care – PPO | Source: Ambulatory Visit | Attending: Internal Medicine | Admitting: Internal Medicine

## 2021-10-21 VITALS — BP 176/102 | HR 63 | Temp 96.9°F | Ht 61.0 in | Wt 118.0 lb

## 2021-10-21 DIAGNOSIS — Z1231 Encounter for screening mammogram for malignant neoplasm of breast: Secondary | ICD-10-CM | POA: Diagnosis not present

## 2021-10-21 DIAGNOSIS — Z124 Encounter for screening for malignant neoplasm of cervix: Secondary | ICD-10-CM | POA: Insufficient documentation

## 2021-10-21 DIAGNOSIS — I10 Essential (primary) hypertension: Secondary | ICD-10-CM | POA: Diagnosis not present

## 2021-10-21 DIAGNOSIS — Z0001 Encounter for general adult medical examination with abnormal findings: Secondary | ICD-10-CM | POA: Diagnosis not present

## 2021-10-21 DIAGNOSIS — Z780 Asymptomatic menopausal state: Secondary | ICD-10-CM

## 2021-10-21 DIAGNOSIS — R87612 Low grade squamous intraepithelial lesion on cytologic smear of cervix (LGSIL): Secondary | ICD-10-CM

## 2021-10-21 MED ORDER — LOSARTAN POTASSIUM 100 MG PO TABS: 100.0000 mg | ORAL_TABLET | Freq: Every day | ORAL | 1 refills | Status: DC

## 2021-10-21 MED ORDER — METAXALONE 400 MG PO TABS: 400.0000 mg | ORAL_TABLET | Freq: Three times a day (TID) | ORAL | 0 refills | Status: DC

## 2021-10-21 NOTE — Progress Notes (Signed)
Subjective:    Patient ID: Carrie Beard, female    DOB: 1956-03-24, 66 y.o.   MRN: [id number]  HPI  Patient presents to clinic today for her annual exam. Of note, her BP today is 158/98. She is taking Losartan as prescribed but reports she ran out yesterday.  Flu: 02/2021 Tetanus: 11/2012 COVID: Pfizer x3 Shingrix: Never Pap smear: 10/2016 Mammogram: 11/2016 Bone density: Never Colon screening: 10/2019 Vision screening: as needed Dentist: as needed  Diet: She does eat meat. She consumes fruits and veggies. She tries to avoid fried foods. She drinks mostly water. Exercise: [place], horse back riding   Review of Systems     Past Medical History:  Diagnosis Date   Allergy    Thyroid disease     Current Outpatient Medications  Medication Sig Dispense Refill   acetaminophen (TYLENOL) 325 MG tablet Take 325 mg by mouth every 6 (six) hours as needed for moderate pain or headache.     desvenlafaxine (PRISTIQ) 50 MG 24 hr tablet Take 1 tablet (50 mg total) by mouth daily. 30 tablet 0   HYDROcodone-acetaminophen (NORCO/VICODIN) 5-325 MG tablet Take 1 tablet by mouth every 6 (six) hours as needed for moderate pain. 30 tablet 0   influenza vac split quadrivalent PF (FLUARIX QUADRIVALENT) 0.5 ML injection Inject into the muscle. 0.5 mL 0   losartan (COZAAR) 50 MG tablet TAKE ONE TABLET BY MOUTH DAILY 30 tablet 0   metroNIDAZOLE (FLAGYL) 500 MG tablet Take 1 tablet (500 mg total) by mouth daily. One po bid x 7 days (Patient not taking: Reported on 01/07/2021) 30 tablet 0   traZODone (DESYREL) 50 MG tablet TAKE 1/2 TO 1 TABLET BY MOUTH EVERY NIGHT AT BEDTIME AS NEEDED FOR SLEEP 90 tablet 0   No current facility-administered medications for this visit.    Allergies  Allergen Reactions   Fire Ant    Tramadol Nausea And Vomiting    Family History  Problem Relation Age of Onset   Hypertension Mother    Hypercholesterolemia Mother    Diabetes Father    Cancer Father        skin,  prostate   Heart disease Father    Hyperlipidemia Father    Kidney disease Father    Diabetes Paternal Grandfather     Social History   Socioeconomic History   Marital status: Single    Spouse name: Not on file   Number of children: 0   Years of education: Not on file   Highest education level: Not on file  Occupational History   Occupation: Geographical information systems officer  Tobacco Use   Smoking status: Former    Types: Cigarettes    Quit date: 05/09/1998    Years since quitting: 23.4   Smokeless tobacco: Never  Vaping Use   Vaping Use: Never used  Substance and Sexual Activity   Alcohol use: Not Currently    Comment: social rare   Drug use: No   Sexual activity: Not Currently    Birth control/protection: Abstinence  Other Topics Concern   Not on file  Social History Narrative   Single   No children   Rides horses   Social Determinants of Health   Financial Resource Strain: Not on file  Food Insecurity: Not on file  Transportation Needs: Not on file  Physical Activity: Not on file  Stress: Not on file  Social Connections: Not on file  Intimate Partner Violence: Not on file     Constitutional: Denies fever, malaise,  fatigue, headache or abrupt weight changes.  HEENT: Denies eye pain, eye redness, ear pain, ringing in the ears, wax buildup, runny nose, nasal congestion, bloody nose, or sore throat. Respiratory: Denies difficulty breathing, shortness of breath, cough or sputum production.   Cardiovascular: Denies chest pain, chest tightness, palpitations or swelling in the hands or feet.  Gastrointestinal: Denies abdominal pain, bloating, constipation, diarrhea or blood in the stool.  GU: Denies urgency, frequency, pain with urination, burning sensation, blood in urine, odor or discharge. Musculoskeletal: Denies decrease in range of motion, difficulty with gait, muscle pain or joint pain and swelling.  Skin: Denies redness, rashes, lesions or ulcercations.  Neurological:  Patient reports insomnia.  Denies dizziness, difficulty with memory, difficulty with speech or problems with balance and coordination.  Psych: Patient has a history of anxiety and depression.  Denies SI/HI.  No other specific complaints in a complete review of systems (except as listed in HPI above).  Objective:   Physical Exam   BP (!) 176/102 (BP Location: Left Arm, Patient Position: Sitting, Cuff Size: Normal)   Pulse 63   Temp (!) 96.9 F (36.1 C) (Temporal)   Ht 5' 1"  (1.549 m)   Wt 118 lb (53.5 kg)   SpO2 99%   BMI 22.30 kg/m   Wt Readings from Last 3 Encounters:  12/22/20 112 lb 9.6 oz (51.1 kg)  12/11/20 114 lb 10.2 oz (52 kg)  12/01/20 116 lb 3.2 oz (52.7 kg)    General: Appears her stated age, well developed, well nourished in NAD. Skin: Warm, dry and intact.  HEENT: Head: normal shape and size; Eyes: sclera white, no icterus, conjunctiva pink, PERRLA and EOMs intact;  Neck:  Neck supple, trachea midline. No masses, lumps or thyromegaly present.  Cardiovascular: Normal rate and rhythm. S1,S2 noted.  No murmur, rubs or gallops noted. No JVD or BLE edema. No carotid bruits noted. Pulmonary/Chest: Normal effort and positive vesicular breath sounds. No respiratory distress. No wheezes, rales or ronchi noted.  Abdomen: Soft and nontender. Normal bowel sounds.  Pelvic: Normal female anatomy.  Cervix friable without mass or lesion.  No CMT.  Adnexa nonpalpable. Musculoskeletal: Strength 5/5 BUE/BLE.  No difficulty with gait.  Neurological: Alert and oriented. Cranial nerves II-XII grossly intact. Coordination normal.  Psychiatric: Mood and affect normal. Behavior is normal. Judgment and thought content normal.   BMET    Component Value Date/Time   NA 141 01/07/2021 1742   NA 140 10/14/2016 0844   K 3.4 (L) 01/07/2021 1742   CL 105 01/07/2021 1742   CO2 20 (L) 01/07/2021 1742   GLUCOSE 136 (H) 01/07/2021 1742   BUN 8 01/07/2021 1742   BUN 17 10/14/2016 0844    CREATININE 0.95 01/07/2021 1742   CREATININE 1.18 (H) 09/28/2020 1600   CALCIUM 9.3 01/07/2021 1742   GFRNONAA >60 01/07/2021 1742   GFRNONAA 71 10/12/2015 0946   GFRAA 69 10/14/2016 0844   GFRAA 82 10/12/2015 0946    Lipid Panel     Component Value Date/Time   CHOL 243 (H) 09/28/2020 1600   CHOL 220 (H) 10/14/2016 0844   TRIG 61 09/28/2020 1600   HDL 138 09/28/2020 1600   HDL 107 10/14/2016 0844   CHOLHDL 1.8 09/28/2020 1600   VLDL 14.0 09/19/2019 1538   LDLCALC 90 09/28/2020 1600    CBC    Component Value Date/Time   WBC 10.1 01/07/2021 1742   RBC 5.00 01/07/2021 1742   HGB 15.3 (H) 01/07/2021 1742  HGB 14.3 10/14/2016 0844   HCT 45.7 01/07/2021 1742   HCT 44.2 10/14/2016 0844   PLT 352 01/07/2021 1742   PLT 268 10/14/2016 0844   MCV 91.4 01/07/2021 1742   MCV 89 10/14/2016 0844   MCH 30.6 01/07/2021 1742   MCHC 33.5 01/07/2021 1742   RDW 12.5 01/07/2021 1742   RDW 14.3 10/14/2016 0844   LYMPHSABS 2.3 01/07/2021 1742   LYMPHSABS 1.8 10/14/2016 0844   MONOABS 0.4 01/07/2021 1742   EOSABS 0.1 01/07/2021 1742   EOSABS 0.1 10/14/2016 0844   BASOSABS 0.0 01/07/2021 1742   BASOSABS 0.0 10/14/2016 0844    Hgb A1C Lab Results  Component Value Date   HGBA1C 5.2 11/17/2012           Assessment & Plan:  Preventative Health Maintenance:  Encouraged her to get a flu shot in the fall Tetanus UTD Encouraged her to get her COVID booster Discussed Shingrix vaccine, she will check coverage with her insurance company and schedule a nurse visit if she would like to have this done Pap smear today, she declines STD screening Mammogram and bone density ordered-she will call to schedule Colon screening UTD Encouraged her to consume a balanced diet and exercise regimen Advised her to see an eye doctor and dentist annually We will check CBC, c-Met, lipid profile today  RTC in 6 months, follow-up chronic conditions Webb Silversmith, NP

## 2021-10-21 NOTE — Patient Instructions (Signed)
Health Maintenance for Postmenopausal Women Menopause is a normal process in which your ability to get pregnant comes to an end. This process happens slowly over many months or years, usually between the ages of 48 and 55. Menopause is complete when you have missed your menstrual period for 12 months. It is important to talk with your health care provider about some of the most common conditions that affect women after menopause (postmenopausal women). These include heart disease, cancer, and bone loss (osteoporosis). Adopting a healthy lifestyle and getting preventive care can help to promote your health and wellness. The actions you take can also lower your chances of developing some of these common conditions. What are the signs and symptoms of menopause? During menopause, you may have the following symptoms: Hot flashes. These can be moderate or severe. Night sweats. Decrease in sex drive. Mood swings. Headaches. Tiredness (fatigue). Irritability. Memory problems. Problems falling asleep or staying asleep. Talk with your health care provider about treatment options for your symptoms. Do I need hormone replacement therapy? Hormone replacement therapy is effective in treating symptoms that are caused by menopause, such as hot flashes and night sweats. Hormone replacement carries certain risks, especially as you become older. If you are thinking about using estrogen or estrogen with progestin, discuss the benefits and risks with your health care provider. How can I reduce my risk for heart disease and stroke? The risk of heart disease, heart attack, and stroke increases as you age. One of the causes may be a change in the body's hormones during menopause. This can affect how your body uses dietary fats, triglycerides, and cholesterol. Heart attack and stroke are medical emergencies. There are many things that you can do to help prevent heart disease and stroke. Watch your blood pressure High  blood pressure causes heart disease and increases the risk of stroke. This is more likely to develop in people who have high blood pressure readings or are overweight. Have your blood pressure checked: Every 3-5 years if you are 18-39 years of age. Every year if you are 40 years old or older. Eat a healthy diet  Eat a diet that includes plenty of vegetables, fruits, low-fat dairy products, and lean protein. Do not eat a lot of foods that are high in solid fats, added sugars, or sodium. Get regular exercise Get regular exercise. This is one of the most important things you can do for your health. Most adults should: Try to exercise for at least 150 minutes each week. The exercise should increase your heart rate and make you sweat (moderate-intensity exercise). Try to do strengthening exercises at least twice each week. Do these in addition to the moderate-intensity exercise. Spend less time sitting. Even light physical activity can be beneficial. Other tips Work with your health care provider to achieve or maintain a healthy weight. Do not use any products that contain nicotine or tobacco. These products include cigarettes, chewing tobacco, and vaping devices, such as e-cigarettes. If you need help quitting, ask your health care provider. Know your numbers. Ask your health care provider to check your cholesterol and your blood sugar (glucose). Continue to have your blood tested as directed by your health care provider. Do I need screening for cancer? Depending on your health history and family history, you may need to have cancer screenings at different stages of your life. This may include screening for: Breast cancer. Cervical cancer. Lung cancer. Colorectal cancer. What is my risk for osteoporosis? After menopause, you may be   at increased risk for osteoporosis. Osteoporosis is a condition in which bone destruction happens more quickly than new bone creation. To help prevent osteoporosis or  the bone fractures that can happen because of osteoporosis, you may take the following actions: If you are 19-50 years old, get at least 1,000 mg of calcium and at least 600 international units (IU) of vitamin D per day. If you are older than age 50 but younger than age 70, get at least 1,200 mg of calcium and at least 600 international units (IU) of vitamin D per day. If you are older than age 70, get at least 1,200 mg of calcium and at least 800 international units (IU) of vitamin D per day. Smoking and drinking excessive alcohol increase the risk of osteoporosis. Eat foods that are rich in calcium and vitamin D, and do weight-bearing exercises several times each week as directed by your health care provider. How does menopause affect my mental health? Depression may occur at any age, but it is more common as you become older. Common symptoms of depression include: Feeling depressed. Changes in sleep patterns. Changes in appetite or eating patterns. Feeling an overall lack of motivation or enjoyment of activities that you previously enjoyed. Frequent crying spells. Talk with your health care provider if you think that you are experiencing any of these symptoms. General instructions See your health care provider for regular wellness exams and vaccines. This may include: Scheduling regular health, dental, and eye exams. Getting and maintaining your vaccines. These include: Influenza vaccine. Get this vaccine each year before the flu season begins. Pneumonia vaccine. Shingles vaccine. Tetanus, diphtheria, and pertussis (Tdap) booster vaccine. Your health care provider may also recommend other immunizations. Tell your health care provider if you have ever been abused or do not feel safe at home. Summary Menopause is a normal process in which your ability to get pregnant comes to an end. This condition causes hot flashes, night sweats, decreased interest in sex, mood swings, headaches, or lack  of sleep. Treatment for this condition may include hormone replacement therapy. Take actions to keep yourself healthy, including exercising regularly, eating a healthy diet, watching your weight, and checking your blood pressure and blood sugar levels. Get screened for cancer and depression. Make sure that you are up to date with all your vaccines. This information is not intended to replace advice given to you by your health care provider. Make sure you discuss any questions you have with your health care provider. Document Revised: 09/14/2020 Document Reviewed: 09/14/2020 Elsevier Patient Education  2023 Elsevier Inc.  

## 2021-10-22 LAB — LIPID PANEL
Cholesterol: 253 mg/dL — ABNORMAL HIGH (ref <200)
HDL: 146 mg/dL (ref >=50)
LDL Cholesterol (Calc): 94 mg/dL (calc)
Non-HDL Cholesterol (Calc): 107 mg/dL (calc) (ref <130)
Total CHOL/HDL Ratio: 1.7 (calc) (ref <5.0)
Triglycerides: 50 mg/dL (ref <150)

## 2021-10-22 LAB — CBC
HCT: 41.4 % (ref 35.0–45.0)
Hemoglobin: 14 g/dL (ref 11.7–15.5)
MCH: 30.4 pg (ref 27.0–33.0)
MCHC: 33.8 g/dL (ref 32.0–36.0)
MCV: 89.8 fL (ref 80.0–100.0)
MPV: 9.7 fL (ref 7.5–12.5)
Platelets: 273 10*3/uL (ref 140–400)
RBC: 4.61 10*6/uL (ref 3.80–5.10)
RDW: 13.2 % (ref 11.0–15.0)
WBC: 7.5 10*3/uL (ref 3.8–10.8)

## 2021-10-22 LAB — COMPLETE METABOLIC PANEL WITH GFR
AG Ratio: 1.6 (calc) (ref 1.0–2.5)
ALT: 17 U/L (ref 6–29)
AST: 18 U/L (ref 10–35)
Albumin: 4.5 g/dL (ref 3.6–5.1)
Alkaline phosphatase (APISO): 57 U/L (ref 37–153)
BUN: 24 mg/dL (ref 7–25)
CO2: 28 mmol/L (ref 20–32)
Calcium: 9.5 mg/dL (ref 8.6–10.4)
Chloride: 101 mmol/L (ref 98–110)
Creat: 0.9 mg/dL (ref 0.50–1.05)
Globulin: 2.8 g/dL (calc) (ref 1.9–3.7)
Glucose, Bld: 86 mg/dL (ref 65–139)
Potassium: 3.9 mmol/L (ref 3.5–5.3)
Sodium: 139 mmol/L (ref 135–146)
Total Bilirubin: 0.5 mg/dL (ref 0.2–1.2)
Total Protein: 7.3 g/dL (ref 6.1–8.1)
eGFR: 72 mL/min/{1.73_m2} (ref >=60)

## 2021-11-01 LAB — CYTOLOGY - PAP

## 2021-11-03 ENCOUNTER — Telehealth: Payer: Self-pay | Admitting: Family Medicine

## 2021-11-03 NOTE — Telephone Encounter (Signed)
Lutricia Horsfall medical referring for [facility]. Voice mail is full unable to leave message.

## 2021-11-08 ENCOUNTER — Encounter: Payer: Self-pay | Admitting: Family Medicine

## 2021-11-08 NOTE — Telephone Encounter (Signed)
On 11/04/21 10:36 am I contacted patient via phone. Patient is requesting to call back and after she contacts her insurance provider to see what will be covered.

## 2021-11-08 NOTE — Telephone Encounter (Signed)
Letter sent by mail. Referring provider contacted.

## 2021-11-08 NOTE — Telephone Encounter (Signed)
I attempt to reach patient via phone. "Call could not be completed as dialed"!

## 2021-11-15 ENCOUNTER — Ambulatory Visit: Payer: BC Managed Care – PPO

## 2021-11-15 NOTE — Progress Notes (Signed)
Hypertension, follow-up  BP Readings from Last 3 Encounters:  11/15/21 (!) 178/110  10/21/21 (!) 176/102  01/08/21 (!) 145/87   Wt Readings from Last 3 Encounters:  10/21/21 118 lb (53.5 kg)  12/22/20 112 lb 9.6 oz (51.1 kg)  12/11/20 114 lb 10.2 oz (52 kg)     She was last seen for hypertension 10/21/2021 BP at that visit was 176/102. Management since that visit includes increasing losartan to 100mg  daily.  She reports excellent compliance with treatment. She is not having side effects.

## 2021-11-16 ENCOUNTER — Other Ambulatory Visit: Payer: Self-pay

## 2021-11-16 MED ORDER — AMLODIPINE BESYLATE 10 MG PO TABS: 10.0000 mg | ORAL_TABLET | Freq: Every day | ORAL | 0 refills | Status: DC

## 2021-11-16 NOTE — Progress Notes (Signed)
Need to add Amlodipine 10 mg daily. Send in #30, 0 refills. Follow up with me in 2 weeks.

## 2021-11-17 ENCOUNTER — Other Ambulatory Visit: Payer: Self-pay | Admitting: Internal Medicine

## 2021-11-17 NOTE — Telephone Encounter (Signed)
Requested Prescriptions  Pending Prescriptions Disp Refills  . desvenlafaxine (PRISTIQ) 50 MG 24 hr tablet [Pharmacy Med Name: DESVENLAFAXINE SUCCNT ER 50 MG] 90 tablet 0    Sig: TAKE 1 TABLET BY MOUTH DAILY     Psychiatry: Antidepressants - SNRI - desvenlafaxine & venlafaxine Failed - 11/17/2021  6:24 AM      Failed - Last BP in normal range    BP Readings from Last 1 Encounters:  11/15/21 (!) 178/110         Failed - Lipid Panel in normal range within the last 12 months    Cholesterol, Total  Date Value Ref Range Status  10/14/2016 220 (H) 100 - 199 mg/dL Final   Cholesterol  Date Value Ref Range Status  10/21/2021 253 (H) <200 mg/dL Final   LDL Cholesterol (Calc)  Date Value Ref Range Status  10/21/2021 94 mg/dL (calc) Final    Comment:    Reference range: <100 . Desirable range <100 mg/dL for primary prevention;   <70 mg/dL for patients with CHD or diabetic patients  with > or = 2 CHD risk factors. Marland Kitchen LDL-C is now calculated using the Martin-Hopkins  calculation, which is a validated novel method providing  better accuracy than the Friedewald equation in the  estimation of LDL-C.  Horald Pollen et al. Lenox Ahr. 0165;537(48): 2061-2068  (http://education.[facility].com/faq/FAQ164)    HDL  Date Value Ref Range Status  10/21/2021 146 > OR = 50 mg/dL Final  27/11/8673 449 >20 mg/dL Final   Triglycerides  Date Value Ref Range Status  10/21/2021 50 <150 mg/dL Final         Passed - Cr in normal range and within 360 days    Creat  Date Value Ref Range Status  10/21/2021 0.90 0.50 - 1.05 mg/dL Final         Passed - Completed PHQ-2 or PHQ-9 in the last 360 days      Passed - Valid encounter within last 6 months    Recent Outpatient Visits          3 weeks ago Encounter for general adult medical examination with abnormal findings   [facility] [place], Salvadore Oxford, NP   11 months ago Alcohol abuse   [facility] [place], [place], NP    11 months ago Pain and swelling of shoulder, right   [facility] [place], Salvadore Oxford, NP   1 year ago Encounter for general adult medical examination with abnormal findings   [facility] [place], Salvadore Oxford, NP   5 years ago Annual physical exam   [facility], Dema Severin, PA-C      Future Appointments            In 1 week Sampson Si, Salvadore Oxford, NP [facility] [place]

## 2021-11-23 ENCOUNTER — Ambulatory Visit
Admission: RE | Admit: 2021-11-23 | Discharge: 2021-11-23 | Disposition: A | Discharge status: Home / Self Care | Payer: BC Managed Care – PPO | Source: Ambulatory Visit | Attending: Internal Medicine | Admitting: Internal Medicine

## 2021-11-23 DIAGNOSIS — M85851 Other specified disorders of bone density and structure, right thigh: Secondary | ICD-10-CM | POA: Diagnosis not present

## 2021-11-23 DIAGNOSIS — Z1231 Encounter for screening mammogram for malignant neoplasm of breast: Secondary | ICD-10-CM | POA: Insufficient documentation

## 2021-11-23 DIAGNOSIS — Z780 Asymptomatic menopausal state: Secondary | ICD-10-CM | POA: Insufficient documentation

## 2021-11-25 ENCOUNTER — Ambulatory Visit: Payer: Self-pay | Admitting: *Deleted

## 2021-11-25 NOTE — Telephone Encounter (Signed)
Summary: Right & Left ankle swelling   Pt is calling to that her right and left ankle is swollen with pain 6/10. Pt reports headache, and dizziness starting amLODipine (NORVASC) 10 MG tablet [[id number]] on 11/16/21. Pt would like to know should she continue to take the medication.      Reason for Disposition . [1] Caller has URGENT medicine question about med that PCP or specialist prescribed AND [2] triager unable to answer question  Answer Assessment - Initial Assessment Questions 1. LOCATION: "Which ankle is swollen?" "Where is the swelling?"     Both ankles, into the calf-1/3 , feet are puffy 2. ONSET: "When did the swelling start?"     Monday 3. SWELLING: "How bad is the swelling?" Or, "How large is it?" (e.g., mild, moderate, severe; size of localized swelling)    - NONE: No joint swelling.   - LOCALIZED: Localized; small area of puffy or swollen skin (e.g., insect bite, skin irritation).   - MILD: Joint looks or feels mildly swollen or puffy.   - MODERATE: Swollen; interferes with normal activities (e.g., work or school); decreased range of movement; may be limping.   - SEVERE: Very swollen; can't move swollen joint at all; limping a lot or unable to walk.     mild 4. PAIN: "Is there any pain?" If Yes, ask: "How bad is it?" (Scale 1-10; or mild, moderate, severe)   - NONE (0): no pain.   - MILD (1-3): doesn't interfere with normal activities.    - MODERATE (4-7): interferes with normal activities (e.g., work or school) or awakens from sleep, limping.    - SEVERE (8-10): excruciating pain, unable to do any normal activities, unable to walk.      moderate 5. CAUSE: "What do you think caused the ankle swelling?"     medication 6. OTHER SYMPTOMS: "Do you have any other symptoms?" (e.g., fever, chest pain, difficulty breathing, calf pain)     no 7. PREGNANCY: "Is there any chance you are pregnant?" "When was your last menstrual period?"  Answer Assessment - Initial Assessment  Questions 1. NAME of MEDICINE: "What medicine(s) are you calling about?"     Amlodipine 10 mg 2. QUESTION: "What is your question?" (e.g., double dose of medicine, side effect)     Patient is having SE 3. PRESCRIBER: "Who prescribed the medicine?" Reason: if prescribed by specialist, call should be referred to that group.     PCP 4. SYMPTOMS: "Do you have any symptoms?" If Yes, ask: "What symptoms are you having?"  "How bad are the symptoms (e.g., mild, moderate, severe)     Started 7/11- dizziness, headache- which have resolved- but now swelling in ankles 5. PREGNANCY:  "Is there any chance that you are pregnant?" "When was your last menstrual period?"  Protocols used: Ankle Swelling-A-AH, Medication Question Call-A-AH

## 2021-11-25 NOTE — Telephone Encounter (Signed)
  Chief Complaint: medication question:Amlodipine 10 mg Symptoms: started new medication- 7/11- was dizzy-that subsided, had headache- that subsided and now started Monday- swelling in ankles Frequency: Patient states she has had varying SE to new BP medication- wants to know if she should stop/half or change. F/U visit 11/30/21 Pertinent Negatives: Patient denies fever, chest pain, difficulty breathing, calf pain Disposition: [] ED /[] Urgent Care (no appt availability in office) / [] Appointment(In office/virtual)/ []  [facility]/ [] Home Care/ [] Refused Recommended Disposition /[] [facility]/ [x]  Follow-up with PCP Additional Notes: Patient is not liking new medication SE- wants PCP advise.

## 2021-11-26 NOTE — Telephone Encounter (Signed)
Have her cut it in half and see how that works for her.  Have her update me in 1 week.

## 2021-11-26 NOTE — Telephone Encounter (Signed)
Pt advised.   Thanks,   -Heitor Steinhoff  

## 2021-11-30 ENCOUNTER — Ambulatory Visit (INDEPENDENT_AMBULATORY_CARE_PROVIDER_SITE_OTHER): Payer: BC Managed Care – PPO | Admitting: Internal Medicine

## 2021-11-30 ENCOUNTER — Encounter: Payer: Self-pay | Admitting: Internal Medicine

## 2021-11-30 VITALS — BP 163/95 | HR 85 | Temp 97.5°F | Wt 120.0 lb

## 2021-11-30 DIAGNOSIS — I10 Essential (primary) hypertension: Secondary | ICD-10-CM

## 2021-11-30 MED ORDER — LABETALOL HCL 100 MG PO TABS: 100.0000 mg | ORAL_TABLET | Freq: Two times a day (BID) | ORAL | 0 refills | Status: DC

## 2021-11-30 MED ORDER — METHOCARBAMOL 500 MG PO TABS: 500.0000 mg | ORAL_TABLET | Freq: Every day | ORAL | 0 refills | Status: AC | PRN

## 2021-11-30 NOTE — Progress Notes (Signed)
Subjective:    Patient ID: Carrie Beard, female    DOB: 01-03-1956, 66 y.o.   MRN: [id number]  HPI  Patient presents to clinic today for follow-up of HTN.  At her last visit, Amlodipine was added in addition to her Losartan.  She did notice some lower extremity edema  with burning sensation, so she cut her dose in half.  Her BP today is 163/95.  ECG from 01/2021 reviewed.  Review of Systems     Past Medical History:  Diagnosis Date   Allergy    Thyroid disease     Current Outpatient Medications  Medication Sig Dispense Refill   acetaminophen (TYLENOL) 325 MG tablet Take 325 mg by mouth every 6 (six) hours as needed for moderate pain or headache.     amLODipine (NORVASC) 10 MG tablet Take 1 tablet (10 mg total) by mouth daily. 30 tablet 0   desvenlafaxine (PRISTIQ) 50 MG 24 hr tablet TAKE 1 TABLET BY MOUTH DAILY 90 tablet 0   HYDROcodone-acetaminophen (NORCO/VICODIN) 5-325 MG tablet Take 1 tablet by mouth every 6 (six) hours as needed for moderate pain. 30 tablet 0   losartan (COZAAR) 100 MG tablet Take 1 tablet (100 mg total) by mouth daily. 90 tablet 1   metaxalone (SKELAXIN) 400 MG tablet Take 1 tablet (400 mg total) by mouth 3 (three) times daily. 90 tablet 0   traZODone (DESYREL) 50 MG tablet TAKE 1/2 TO 1 TABLET BY MOUTH EVERY NIGHT AT BEDTIME AS NEEDED FOR SLEEP 90 tablet 0   No current facility-administered medications for this visit.    Allergies  Allergen Reactions   Fire Ant    Tramadol Nausea And Vomiting    Family History  Problem Relation Age of Onset   Hypertension Mother    Hypercholesterolemia Mother    Diabetes Father    Cancer Father        skin, prostate   Heart disease Father    Hyperlipidemia Father    Kidney disease Father    Diabetes Paternal Grandfather     Social History   Socioeconomic History   Marital status: Single    Spouse name: Not on file   Number of children: 0   Years of education: Not on file   Highest education level: Not  on file  Occupational History   Occupation: Insurance claims handler  Tobacco Use   Smoking status: Former    Types: Cigarettes    Quit date: 05/09/1998    Years since quitting: 23.5   Smokeless tobacco: Never  Vaping Use   Vaping Use: Never used  Substance and Sexual Activity   Alcohol use: Not Currently    Comment: social rare   Drug use: No   Sexual activity: Not Currently    Birth control/protection: Abstinence  Other Topics Concern   Not on file  Social History Narrative   Single   No children   Rides horses   Social Determinants of Health   Financial Resource Strain: Not on file  Food Insecurity: Not on file  Transportation Needs: Not on file  Physical Activity: Not on file  Stress: Not on file  Social Connections: Not on file  Intimate Partner Violence: Not on file     Constitutional: Denies fever, malaise, fatigue, headache or abrupt weight changes.  Respiratory: Denies difficulty breathing, shortness of breath, cough or sputum production.   Cardiovascular: Patient reports swelling of her feet.  Denies chest pain, chest tightness, palpitations or swelling in the hand.  Musculoskeletal: Denies decrease in range of motion, difficulty with gait, muscle pain or joint pain and swelling.  Skin: Denies redness, rashes, lesions or ulcercations.  Neurological: Denies dizziness, difficulty with memory, difficulty with speech or problems with balance and coordination.    No other specific complaints in a complete review of systems (except as listed in HPI above).  Objective:   Physical Exam  BP (!) 163/95 (BP Location: Left Arm, Patient Position: Sitting, Cuff Size: Small)   Pulse 85   Temp (!) 97.5 F (36.4 C) (Temporal)   Wt 120 lb (54.4 kg)   SpO2 99%   BMI 22.67 kg/m   Wt Readings from Last 3 Encounters:  10/21/21 118 lb (53.5 kg)  12/22/20 112 lb 9.6 oz (51.1 kg)  12/11/20 114 lb 10.2 oz (52 kg)    General: Appears her stated age, well developed, well  nourished in NAD. Skin: Warm, dry and intact.  Cardiovascular: Normal rate and rhythm. S1,S2 noted.  No murmur, rubs or gallops noted.Trace BLE edema. No carotid bruits noted. Pulmonary/Chest: Normal effort and positive vesicular breath sounds. No respiratory distress. No wheezes, rales or ronchi noted.  Neurological: Alert and oriented.   BMET    Component Value Date/Time   NA 139 10/21/2021 1541   NA 140 10/14/2016 0844   K 3.9 10/21/2021 1541   CL 101 10/21/2021 1541   CO2 28 10/21/2021 1541   GLUCOSE 86 10/21/2021 1541   BUN 24 10/21/2021 1541   BUN 17 10/14/2016 0844   CREATININE 0.90 10/21/2021 1541   CALCIUM 9.5 10/21/2021 1541   GFRNONAA >60 01/07/2021 1742   GFRNONAA 71 10/12/2015 0946   GFRAA 69 10/14/2016 0844   GFRAA 82 10/12/2015 0946    Lipid Panel     Component Value Date/Time   CHOL 253 (H) 10/21/2021 1541   CHOL 220 (H) 10/14/2016 0844   TRIG 50 10/21/2021 1541   HDL 146 10/21/2021 1541   HDL 107 10/14/2016 0844   CHOLHDL 1.7 10/21/2021 1541   VLDL 14.0 09/19/2019 1538   LDLCALC 94 10/21/2021 1541    CBC    Component Value Date/Time   WBC 7.5 10/21/2021 1541   RBC 4.61 10/21/2021 1541   HGB 14.0 10/21/2021 1541   HGB 14.3 10/14/2016 0844   HCT 41.4 10/21/2021 1541   HCT 44.2 10/14/2016 0844   PLT 273 10/21/2021 1541   PLT 268 10/14/2016 0844   MCV 89.8 10/21/2021 1541   MCV 89 10/14/2016 0844   MCH 30.4 10/21/2021 1541   MCHC 33.8 10/21/2021 1541   RDW 13.2 10/21/2021 1541   RDW 14.3 10/14/2016 0844   LYMPHSABS 2.3 01/07/2021 1742   LYMPHSABS 1.8 10/14/2016 0844   MONOABS 0.4 01/07/2021 1742   EOSABS 0.1 01/07/2021 1742   EOSABS 0.1 10/14/2016 0844   BASOSABS 0.0 01/07/2021 1742   BASOSABS 0.0 10/14/2016 0844    Hgb A1C Lab Results  Component Value Date   HGBA1C 5.2 11/17/2012            Assessment & Plan:     RTC in 5 months, follow-up chronic conditions Nicki Reaper, NP

## 2021-11-30 NOTE — Assessment & Plan Note (Signed)
Remains uncontrolled Continue losartan Unable to tolerate amlodipine, will DC Rx for labetalol 100 mg twice daily Reinforced DASH diet Rx for blood pressure monitor so that she can monitor at home and update me in 2 weeks

## 2021-11-30 NOTE — Patient Instructions (Signed)
How to Take Your Blood Pressure Blood pressure measures how strongly your blood is pressing against the walls of your arteries. Arteries are blood vessels that carry blood from your heart throughout your body. You can take your blood pressure at home with a machine. You may need to check your blood pressure at home: To check if you have high blood pressure (hypertension). To check your blood pressure over time. To make sure your blood pressure medicine is working. Supplies needed: Blood pressure machine, or monitor. A chair to sit in. This should be a chair where you can sit upright with your back supported. Do not sit on a soft couch or an armchair. Table or desk. Small notebook. Pencil or pen. How to prepare Avoid these things for 30 minutes before checking your blood pressure: Having drinks with caffeine in them, such as coffee or tea. Drinking alcohol. Eating. Smoking. Exercising. Do these things five minutes before checking your blood pressure: Go to the bathroom and pee (urinate). Sit in a chair. Be quiet. Do not talk. How to take your blood pressure Follow the instructions that came with your machine. If you have a digital blood pressure monitor, these may be the instructions: Sit up straight. Place your feet on the floor. Do not cross your ankles or legs. Rest your left arm at the level of your heart. You may rest it on a table, desk, or chair. Pull up your shirt sleeve. Wrap the blood pressure cuff around the upper part of your left arm. The cuff should be 1 inch (2.5 cm) above your elbow. It is best to wrap the cuff around bare skin. Fit the cuff snugly around your arm, but not too tightly. You should be able to place only one finger between the cuff and your arm. Place the cord so that it rests in the bend of your elbow. Press the power button. Sit quietly while the cuff fills with air and loses air. Write down the numbers on the screen. Wait 2-3 minutes and then repeat  steps 1-10. What do the numbers mean? Two numbers make up your blood pressure. The first number is called systolic pressure. The second is called diastolic pressure. An example of a blood pressure reading is "120 over 80" (or 120/80). If you are an adult and do not have a medical condition, use this guide to find out if your blood pressure is normal: Normal First number: below 120. Second number: below 80. Elevated First number: 120-129. Second number: below 80. Hypertension stage 1 First number: 130-139. Second number: 80-89. Hypertension stage 2 First number: 140 or above. Second number: 90 or above. Your blood pressure is above normal even if only the first or only the second number is above normal. Follow these instructions at home: Medicines Take over-the-counter and prescription medicines only as told by your doctor. Tell your doctor if your medicine is causing side effects. General instructions Check your blood pressure as often as your doctor tells you to. Check your blood pressure at the same time every day. Take your monitor to your next doctor's appointment. Your doctor will: Make sure you are using it correctly. Make sure it is working right. Understand what your blood pressure numbers should be. Keep all follow-up visits. General tips You will need a blood pressure machine or monitor. Your doctor can suggest a monitor. You can buy one at a drugstore or online. When choosing one: Choose one with an arm cuff. Choose one that wraps around your   upper arm. Only one finger should fit between your arm and the cuff. Do not choose one that measures your blood pressure from your wrist or finger. Where to find more information American Heart Association: www.heart.org Contact a doctor if: Your blood pressure keeps being high. Your blood pressure is suddenly low. Get help right away if: Your first blood pressure number is higher than 180. Your second blood pressure number is  higher than 120. These symptoms may be an emergency. Do not wait to see if the symptoms will go away. Get help right away. Call 911. Summary Check your blood pressure at the same time every day. Avoid caffeine, alcohol, smoking, and exercise for 30 minutes before checking your blood pressure. Make sure you understand what your blood pressure numbers should be. This information is not intended to replace advice given to you by your health care provider. Make sure you discuss any questions you have with your health care provider. Document Revised: 01/07/2021 Document Reviewed: 01/07/2021 Elsevier Patient Education  2023 Elsevier Inc.  

## 2021-12-13 ENCOUNTER — Other Ambulatory Visit: Payer: Self-pay | Admitting: Internal Medicine

## 2021-12-14 NOTE — Telephone Encounter (Signed)
dc'd 11/30/21 Nicki Reaper NP  Requested Prescriptions  Refused Prescriptions Disp Refills  . amLODipine (NORVASC) 10 MG tablet [Pharmacy Med Name: amLODIPine BESYLATE 10 MG TAB] 30 tablet 0    Sig: TAKE 1 TABLET BY MOUTH DAILY     Cardiovascular: Calcium Channel Blockers 2 Failed - 12/13/2021  6:22 AM      Failed - Last BP in normal range    BP Readings from Last 1 Encounters:  11/30/21 (!) 163/95         Passed - Last Heart Rate in normal range    Pulse Readings from Last 1 Encounters:  11/30/21 85         Passed - Valid encounter within last 6 months    Recent Outpatient Visits          2 weeks ago Primary hypertension   [facility] [place], Salvadore Oxford, NP   1 month ago Encounter for general adult medical examination with abnormal findings   [facility] [place], Salvadore Oxford, NP   11 months ago Alcohol abuse   [facility] [place], Salvadore Oxford, NP   1 year ago Pain and swelling of shoulder, right   [facility] [place], Salvadore Oxford, NP   1 year ago Encounter for general adult medical examination with abnormal findings   [facility] [place], Salvadore Oxford, NP

## 2021-12-27 ENCOUNTER — Other Ambulatory Visit: Payer: Self-pay | Admitting: Internal Medicine

## 2021-12-27 DIAGNOSIS — I10 Essential (primary) hypertension: Secondary | ICD-10-CM

## 2021-12-28 NOTE — Telephone Encounter (Signed)
Requested Prescriptions  Pending Prescriptions Disp Refills  . labetalol (NORMODYNE) 100 MG tablet [Pharmacy Med Name: LABETALOL HCL 100 MG TABLET] 60 tablet 0    Sig: TAKE 1 TABLET BY MOUTH TWICE A DAY     Cardiovascular:  Beta Blockers Failed - 12/27/2021  6:22 AM      Failed - Last BP in normal range    BP Readings from Last 1 Encounters:  11/30/21 (!) 163/95         Passed - Last Heart Rate in normal range    Pulse Readings from Last 1 Encounters:  11/30/21 85         Passed - Valid encounter within last 6 months    Recent Outpatient Visits          4 weeks ago Primary hypertension   [facility] [place], Salvadore Oxford, NP   2 months ago Encounter for general adult medical examination with abnormal findings   [facility] [place], Salvadore Oxford, NP   1 year ago Alcohol abuse   [facility] [place], Salvadore Oxford, NP   1 year ago Pain and swelling of shoulder, right   [facility] [place], Salvadore Oxford, NP   1 year ago Encounter for general adult medical examination with abnormal findings   [facility] [place], Salvadore Oxford, NP

## 2022-01-28 ENCOUNTER — Ambulatory Visit: Payer: Self-pay

## 2022-01-28 NOTE — Patient Outreach (Signed)
  Care Coordination   Initial Visit Note   01/28/2022 Name: Carrie Beard MRN: [id number] DOB: Sep 26, 1955  Carrie Beard is a 66 y.o. year old female who sees Baity, Coralie Keens, NP for primary care. I spoke with  [name] by phone today.  What matters to the patients health and wellness today?  Management of blood pressures     Goals Addressed             This Visit's Progress    RNCM: Effective Management of HTN       Care Coordination Interventions: BP Readings from Last 3 Encounters:  11/30/21 (!) 163/95  11/15/21 (!) 178/110  10/21/21 (!) 176/102    Evaluation of current treatment plan related to hypertension self management and patient's adherence to plan as established by provider. The patient states she cannot take the labetalol it caused her to gain weight and feel like she was breathing under water. She states that she needs to follow up with the provider but her horse has been sick and she has had to focus her attention on the horse. Education and support provided.  Provided education to patient re: stroke prevention, s/s of heart attack and stroke Reviewed medications with patient and discussed importance of compliance. The patient is taking the losartan but has stopped the labetalol. The patient also has not picked up a blood pressure cuff so she does not know what her blood pressures have been running.   Discussed plans with patient for ongoing care management follow up and provided patient with direct contact information for care management team Advised patient, providing education and rationale, to monitor blood pressure daily and record, calling PCP for findings outside established parameters. Plans on getting a blood pressure cuff but feels that her blood pressures are better now that her horse is better and healing Reviewed scheduled/upcoming provider appointments including: encouraged the patient to call and get a follow up appointment with the pcp.  Advised patient to  discuss changes in sx and sx, questions or concerns with provider Discussed complications of poorly controlled blood pressure such as heart disease, stroke, circulatory complications, vision complications, kidney impairment, sexual dysfunction Screening for signs and symptoms of depression related to chronic disease state  Assessed social determinant of health barriers The patient agrees to outreaches from the [facility]. Review of the care coordination services and available resources. The patient knows how to reach the [facility] for changes or new concerns before the next outreach.           SDOH assessments and interventions completed:  Yes  SDOH Interventions Today    Flowsheet Row Most Recent Value  SDOH Interventions   Food Insecurity Interventions Intervention Not Indicated  Housing Interventions Intervention Not Indicated  Transportation Interventions Intervention Not Indicated  Utilities Interventions Intervention Not Indicated        Care Coordination Interventions Activated:  Yes  Care Coordination Interventions:  Yes, provided   Follow up plan: Follow up call scheduled for 03-09-2022    Encounter Outcome:  Pt. Visit Completed   Noreene Larsson RN, MSN, [place]  Mobile: [phone]

## 2022-01-28 NOTE — Patient Instructions (Signed)
Visit Information  Thank you for taking time to visit with me today. Please don't hesitate to contact me if I can be of assistance to you.   Following are the goals we discussed today:   Goals Addressed             This Visit's Progress    RNCM: Effective Management of HTN       Care Coordination Interventions: BP Readings from Last 3 Encounters:  11/30/21 (!) 163/95  11/15/21 (!) 178/110  10/21/21 (!) 176/102    Evaluation of current treatment plan related to hypertension self management and patient's adherence to plan as established by provider. The patient states she cannot take the labetalol it caused her to gain weight and feel like she was breathing under water. She states that she needs to follow up with the provider but her horse has been sick and she has had to focus her attention on the horse. Education and support provided.  Provided education to patient re: stroke prevention, s/s of heart attack and stroke Reviewed medications with patient and discussed importance of compliance. The patient is taking the losartan but has stopped the labetalol. The patient also has not picked up a blood pressure cuff so she does not know what her blood pressures have been running.   Discussed plans with patient for ongoing care management follow up and provided patient with direct contact information for care management team Advised patient, providing education and rationale, to monitor blood pressure daily and record, calling PCP for findings outside established parameters. Plans on getting a blood pressure cuff but feels that her blood pressures are better now that her horse is better and healing Reviewed scheduled/upcoming provider appointments including: encouraged the patient to call and get a follow up appointment with the pcp.  Advised patient to discuss changes in sx and sx, questions or concerns with provider Discussed complications of poorly controlled blood pressure such as heart  disease, stroke, circulatory complications, vision complications, kidney impairment, sexual dysfunction Screening for signs and symptoms of depression related to chronic disease state  Assessed social determinant of health barriers The patient agrees to outreaches from the [facility]. Review of the care coordination services and available resources. The patient knows how to reach the [facility] for changes or new concerns before the next outreach.           Our next appointment is by telephone on 03-09-2022 at 345 pm  Please call the care guide team at [phone] if you need to cancel or reschedule your appointment.   If you are experiencing a Mental Health or [place] or need someone to talk to, please call the Suicide and Crisis Lifeline: 988 call the [place] National Suicide Prevention Lifeline: 731-852-7600 or TTY: 470-817-8073 TTY (805)196-0052) to talk to a trained counselor call 1-800-273-TALK (toll free, 24 hour hotline)  Patient verbalizes understanding of instructions and care plan provided today and agrees to view in [place]. Active MyChart status and patient understanding of how to access instructions and care plan via MyChart confirmed with patient.     Telephone follow up appointment with care management team member scheduled for: 03-09-2022 at 345 pm

## 2022-02-15 ENCOUNTER — Other Ambulatory Visit (HOSPITAL_BASED_OUTPATIENT_CLINIC_OR_DEPARTMENT_OTHER): Payer: Self-pay

## 2022-02-18 ENCOUNTER — Other Ambulatory Visit (HOSPITAL_BASED_OUTPATIENT_CLINIC_OR_DEPARTMENT_OTHER): Payer: Self-pay

## 2022-02-18 MED ORDER — FLUARIX QUADRIVALENT 0.5 ML IM SUSY
PREFILLED_SYRINGE | INTRAMUSCULAR | 0 refills | Status: DC
  Filled 2022-02-18: qty 0.5, 1d supply, fill #0

## 2022-03-10 ENCOUNTER — Ambulatory Visit: Payer: Self-pay | Admitting: *Deleted

## 2022-03-10 NOTE — Patient Outreach (Signed)
  Care Coordination   Follow Up Visit Note   03/10/2022 Name: Sabiha Sura MRN: [id number] DOB: 12-May-1955  Tomara Youngberg is a 66 y.o. year old female who sees Baity, Coralie Keens, NP for primary care. I spoke with  [name] by phone today.  What matters to the patients health and wellness today?  State she is working and will call this RNCM back later today.    SDOH assessments and interventions completed:  No     Care Coordination Interventions Activated:  No  Care Coordination Interventions:  No, not indicated   Follow up plan:  Will have care guide reschedule if no call back.    Encounter Outcome:  Pt. Request to Call [place], RN, MSN, [place] [facility] Care Management Coordinator [phone]

## 2022-03-21 ENCOUNTER — Telehealth: Payer: Self-pay | Admitting: *Deleted

## 2022-03-21 NOTE — Progress Notes (Signed)
  Care Coordination Note  03/21/2022 Name: [name] MRN: [id number] DOB: 1956-02-07  Carrie Beard is a 66 y.o. year old female who is a primary care patient of Lorre Munroe, NP and is actively engaged with the care management team. I reached out to Ronalee Belts by phone today to assist with re-scheduling a follow up visit with the RN Case Manager  Follow up plan: Telephone appointment with care management team member scheduled for: 04/05/2022  Burman Nieves, [facility] Care Coordination Care Guide Direct Dial: [phone]

## 2022-04-05 ENCOUNTER — Ambulatory Visit: Payer: Self-pay | Admitting: *Deleted

## 2022-04-05 NOTE — Patient Outreach (Signed)
  Care Coordination   Follow Up Visit Note   04/05/2022 Name: Carrie Beard MRN: [id number] DOB: June 05, 1955  Carrie Beard is a 66 y.o. year old female who sees Baity, Salvadore Oxford, NP for primary care. I spoke with  Ronalee Belts by phone today.  What matters to the patients health and wellness today?  State she is not as stressed as she has been the last couple months dealing with her horse.  Feels she will be now less stressed and able to focus more on her own health.    Goals Addressed             This Visit's Progress    RNCM: Effective Management of HTN   Not on track    Care Coordination Interventions: BP Readings from Last 3 Encounters:  11/30/21 (!) 163/95  11/15/21 (!) 178/110  10/21/21 (!) 176/102    Evaluation of current treatment plan related to hypertension self management and patient's adherence to plan as established by provider. She has had to alter some of her medications due to side effects.  Currently taking 100 mg Losartan. Education and support provided.  Provided education to patient re: stroke prevention, s/s of heart attack and stroke Reviewed medications with patient and discussed importance of compliance. The patient is taking the losartan but has stopped the labetalol. The patient also has not picked up a blood pressure cuff so she does not know what her blood pressures have been running.   Discussed plans with patient for ongoing care management follow up and provided patient with direct contact information for care management team Advised patient, providing education and rationale, to monitor blood pressure daily and record, calling PCP for findings outside established parameters.  Reviewed scheduled/upcoming provider appointments including: encouraged the patient to call and get a follow up appointment with the PCP as the last visit was in July and there was recommendation for 5 month follow up.  Advised patient to discuss changes in sx and sx, questions or  concerns with provider Discussed complications of poorly controlled blood pressure such as heart disease, stroke, circulatory complications, vision complications, kidney impairment, sexual dysfunction Discussed blood pressure trends, state they have remained elevated but she has not been monitoring daily.  Advised to do so, document readings, and take with her to PCP appointment next month The patient agrees to outreaches from the [facility]. Review of the care coordination services and available resources. The patient knows how to reach the [facility] for changes or new concerns before the next outreach.           SDOH assessments and interventions completed:  No     Care Coordination Interventions:  Yes, provided   Follow up plan: Follow up call scheduled for 1/3    Encounter Outcome:  Pt. Visit Completed   Kemper Durie, RN, MSN, [facility] Care Management Care Management Coordinator [phone]

## 2022-04-05 NOTE — Patient Instructions (Signed)
Visit Information  Thank you for taking time to visit with me today. Please don't hesitate to contact me if I can be of assistance to you before our next scheduled telephone appointment.  Following are the goals we discussed today:  Take blood pressure at least daily and record readings.  Share with provider.  Our next appointment is by telephone on 1/3  Please call the care guide team at [phone] if you need to cancel or reschedule your appointment.   Please call the Suicide and Crisis Lifeline: 988 call the [place] National Suicide Prevention Lifeline: (234) 530-9995 or TTY: 4436147651 TTY (269) 337-1730) to talk to a trained counselor call 1-800-273-TALK (toll free, 24 hour hotline) call 911 if you are experiencing a Mental Health or Behavioral Health Crisis or need someone to talk to.  Patient verbalizes understanding of instructions and care plan provided today and agrees to view in MyChart. Active MyChart status and patient understanding of how to access instructions and care plan via MyChart confirmed with patient.     The patient has been provided with contact information for the care management team and has been advised to call with any health related questions or concerns.   Kemper Durie, RN, MSN, [facility] Care Management Care Management Coordinator [phone]

## 2022-04-16 ENCOUNTER — Other Ambulatory Visit: Payer: Self-pay | Admitting: Internal Medicine

## 2022-04-18 NOTE — Telephone Encounter (Signed)
Requested Prescriptions  Pending Prescriptions Disp Refills   losartan (COZAAR) 100 MG tablet [Pharmacy Med Name: LOSARTAN POTASSIUM 100 MG TAB] 90 tablet 0    Sig: TAKE 1 TABLET BY MOUTH DAILY     Cardiovascular:  Angiotensin Receptor Blockers Failed - 04/16/2022  6:52 AM      Failed - Last BP in normal range    BP Readings from Last 1 Encounters:  11/30/21 (!) 163/95         Passed - Cr in normal range and within 180 days    Creat  Date Value Ref Range Status  10/21/2021 0.90 0.50 - 1.05 mg/dL Final         Passed - K in normal range and within 180 days    Potassium  Date Value Ref Range Status  10/21/2021 3.9 3.5 - 5.3 mmol/L Final         Passed - Patient is not pregnant      Passed - Valid encounter within last 6 months    Recent Outpatient Visits           4 months ago Primary hypertension   [facility] [place], Salvadore Oxford, NP   5 months ago Encounter for general adult medical examination with abnormal findings   [facility] [place], Salvadore Oxford, NP   1 year ago Alcohol abuse   [facility] [place], Salvadore Oxford, NP   1 year ago Pain and swelling of shoulder, right   [facility] [place], Salvadore Oxford, NP   1 year ago Encounter for general adult medical examination with abnormal findings   [facility] [place], Salvadore Oxford, NP       Future Appointments             In 2 weeks Sampson Si, Salvadore Oxford, NP [facility], [facility]

## 2022-04-28 ENCOUNTER — Other Ambulatory Visit: Payer: Self-pay | Admitting: Internal Medicine

## 2022-04-28 ENCOUNTER — Ambulatory Visit (INDEPENDENT_AMBULATORY_CARE_PROVIDER_SITE_OTHER): Payer: BC Managed Care – PPO | Admitting: Internal Medicine

## 2022-04-28 ENCOUNTER — Encounter: Payer: Self-pay | Admitting: Internal Medicine

## 2022-04-28 VITALS — BP 128/68 | HR 69

## 2022-04-28 DIAGNOSIS — E0590 Thyrotoxicosis, unspecified without thyrotoxic crisis or storm: Secondary | ICD-10-CM

## 2022-04-28 DIAGNOSIS — F419 Anxiety disorder, unspecified: Secondary | ICD-10-CM

## 2022-04-28 DIAGNOSIS — F5101 Primary insomnia: Secondary | ICD-10-CM

## 2022-04-28 DIAGNOSIS — I10 Essential (primary) hypertension: Secondary | ICD-10-CM

## 2022-04-28 DIAGNOSIS — F32A Depression, unspecified: Secondary | ICD-10-CM

## 2022-04-28 DIAGNOSIS — Z23 Encounter for immunization: Secondary | ICD-10-CM

## 2022-04-28 MED ORDER — TRAZODONE HCL 50 MG PO TABS: ORAL_TABLET | ORAL | 1 refills | Status: AC

## 2022-04-28 MED ORDER — DESVENLAFAXINE SUCCINATE ER 50 MG PO TB24: 50.0000 mg | ORAL_TABLET | Freq: Every day | ORAL | 1 refills | Status: DC

## 2022-04-28 MED ORDER — LOSARTAN POTASSIUM 100 MG PO TABS: 100.0000 mg | ORAL_TABLET | Freq: Every day | ORAL | 1 refills | Status: DC

## 2022-04-28 NOTE — Addendum Note (Signed)
Addended by: Kavin Leech E on: 04/28/2022 03:49 PM   Modules accepted: Orders

## 2022-04-28 NOTE — Assessment & Plan Note (Signed)
Stable on current dose of desvenlafaxine Support offered

## 2022-04-28 NOTE — Patient Instructions (Signed)

## 2022-04-28 NOTE — Assessment & Plan Note (Signed)
Continue trazodone 

## 2022-04-28 NOTE — Assessment & Plan Note (Signed)
Will check TSH at annual exam 

## 2022-04-28 NOTE — Progress Notes (Signed)
Subjective:    Patient ID: Carrie Beard, female    DOB: [birth year], 65 y.o.   MRN: [id number]  HPI  Patient presents to clinic today for follow-up of chronic conditions.  HTN: Her BP today is 128/68.  She is taking Losartan as prescribed. She is not taking Labetalol as prescribed.  ECG from 01/2019 reviewed.  Hypothyroidism: She is not taking any medications for this at this time.  She does not follow with endocrinology.  Anxiety and Depression: Chronic, managed on Desvenlafaxine.  She is not currently seeing a therapist.  She denies SI/HI.  Insomnia: She has difficulty staying asleep.  She takes Trazodone as needed with good relief of symptoms.  There is no sleep study on file.  Review of Systems     Past Medical History:  Diagnosis Date   Allergy    Thyroid disease     Current Outpatient Medications  Medication Sig Dispense Refill   acetaminophen (TYLENOL) 325 MG tablet Take 325 mg by mouth every 6 (six) hours as needed for moderate pain or headache.     desvenlafaxine (PRISTIQ) 50 MG 24 hr tablet TAKE 1 TABLET BY MOUTH DAILY 90 tablet 0   HYDROcodone-acetaminophen (NORCO/VICODIN) 5-325 MG tablet Take 1 tablet by mouth every 6 (six) hours as needed for moderate pain. 30 tablet 0   influenza vac split quadrivalent PF (FLUARIX QUADRIVALENT) 0.5 ML injection Inject into the muscle. 0.5 mL 0   labetalol (NORMODYNE) 100 MG tablet TAKE 1 TABLET BY MOUTH TWICE A DAY (Patient not taking: Reported on 01/28/2022) 60 tablet 0   losartan (COZAAR) 100 MG tablet TAKE 1 TABLET BY MOUTH DAILY 90 tablet 0   methocarbamol (ROBAXIN) 500 MG tablet Take 1 tablet (500 mg total) by mouth daily as needed for muscle spasms. 30 tablet 0   traZODone (DESYREL) 50 MG tablet TAKE 1/2 TO 1 TABLET BY MOUTH EVERY NIGHT AT BEDTIME AS NEEDED FOR SLEEP 90 tablet 0   No current facility-administered medications for this visit.    Allergies  Allergen Reactions   Fire Ant    Tramadol Nausea And Vomiting     Family History  Problem Relation Age of Onset   Hypertension Mother    Hypercholesterolemia Mother    Diabetes Father    Cancer Father        skin, prostate   Heart disease Father    Hyperlipidemia Father    Kidney disease Father    Diabetes Paternal Grandfather     Social History   Socioeconomic History   Marital status: Single    Spouse name: Not on file   Number of children: 0   Years of education: Not on file   Highest education level: Not on file  Occupational History   Occupation: Insurance claims handler  Tobacco Use   Smoking status: Former    Types: Cigarettes    Quit date: 05/09/1998    Years since quitting: 23.9   Smokeless tobacco: Never  Vaping Use   Vaping Use: Never used  Substance and Sexual Activity   Alcohol use: Not Currently    Comment: social rare   Drug use: No   Sexual activity: Not Currently    Birth control/protection: Abstinence  Other Topics Concern   Not on file  Social History Narrative   Single   No children   Rides horses   Social Determinants of Health   Financial Resource Strain: Not on file  Food Insecurity: No Food Insecurity (01/28/2022)   Hunger Vital  Sign    Worried About Programme researcher, broadcasting/film/video in the Last Year: Never true    Ran Out of Food in the Last Year: Never true  Transportation Needs: No Transportation Needs (01/28/2022)   PRAPARE - Administrator, Civil Service (Medical): No    Lack of Transportation (Non-Medical): No  Physical Activity: Not on file  Stress: Not on file  Social Connections: Not on file  Intimate Partner Violence: Not on file     Constitutional: Denies fever, malaise, fatigue, headache or abrupt weight changes.  HEENT: Denies eye pain, eye redness, ear pain, ringing in the ears, wax buildup, runny nose, nasal congestion, bloody nose, or sore throat. Respiratory: Denies difficulty breathing, shortness of breath, cough or sputum production.   Cardiovascular: Denies chest pain, chest  tightness, palpitations or swelling in the hands or feet.  Gastrointestinal: Denies abdominal pain, bloating, constipation, diarrhea or blood in the stool.  GU: Denies urgency, frequency, pain with urination, burning sensation, blood in urine, odor or discharge. Musculoskeletal: Denies decrease in range of motion, difficulty with gait, muscle pain or joint pain and swelling.  Skin: Denies redness, rashes, lesions or ulcercations.  Neurological: Patient reports insomnia.  Denies dizziness, difficulty with memory, difficulty with speech or problems with balance and coordination.  Psych: Patient has a history of anxiety and depression.  Denies SI/HI.  No other specific complaints in a complete review of systems (except as listed in HPI above).  Objective:   Physical Exam  BP 128/68   Pulse 69   SpO2 99%   Wt Readings from Last 3 Encounters:  11/30/21 120 lb (54.4 kg)  10/21/21 118 lb (53.5 kg)  12/22/20 112 lb 9.6 oz (51.1 kg)    General: Appears her stated age, well developed, well nourished in NAD. Skin: Warm, dry and intact.  HEENT: Head: normal shape and size; Eyes: sclera white, no icterus, conjunctiva pink, PERRLA and EOMs intact;  Cardiovascular: Normal rate and rhythm. S1,S2 noted.  No murmur, rubs or gallops noted. No JVD or BLE edema. No carotid bruits noted. Pulmonary/Chest: Normal effort and positive vesicular breath sounds. No respiratory distress. No wheezes, rales or ronchi noted.  Musculoskeletal: No difficulty with gait.  Neurological: Alert and oriented. Coordination normal.  Psychiatric: Mood and affect normal. Behavior is normal. Judgment and thought content normal.   BMET    Component Value Date/Time   NA 139 10/21/2021 1541   NA 140 10/14/2016 0844   K 3.9 10/21/2021 1541   CL 101 10/21/2021 1541   CO2 28 10/21/2021 1541   GLUCOSE 86 10/21/2021 1541   BUN 24 10/21/2021 1541   BUN 17 10/14/2016 0844   CREATININE 0.90 10/21/2021 1541   CALCIUM 9.5  10/21/2021 1541   GFRNONAA >60 01/07/2021 1742   GFRNONAA 71 10/12/2015 0946   GFRAA 69 10/14/2016 0844   GFRAA 82 10/12/2015 0946    Lipid Panel     Component Value Date/Time   CHOL 253 (H) 10/21/2021 1541   CHOL 220 (H) 10/14/2016 0844   TRIG 50 10/21/2021 1541   HDL 146 10/21/2021 1541   HDL 107 10/14/2016 0844   CHOLHDL 1.7 10/21/2021 1541   VLDL 14.0 09/19/2019 1538   LDLCALC 94 10/21/2021 1541    CBC    Component Value Date/Time   WBC 7.5 10/21/2021 1541   RBC 4.61 10/21/2021 1541   HGB 14.0 10/21/2021 1541   HGB 14.3 10/14/2016 0844   HCT 41.4 10/21/2021 1541   HCT  44.2 10/14/2016 0844   PLT 273 10/21/2021 1541   PLT 268 10/14/2016 0844   MCV 89.8 10/21/2021 1541   MCV 89 10/14/2016 0844   MCH 30.4 10/21/2021 1541   MCHC 33.8 10/21/2021 1541   RDW 13.2 10/21/2021 1541   RDW 14.3 10/14/2016 0844   LYMPHSABS 2.3 01/07/2021 1742   LYMPHSABS 1.8 10/14/2016 0844   MONOABS 0.4 01/07/2021 1742   EOSABS 0.1 01/07/2021 1742   EOSABS 0.1 10/14/2016 0844   BASOSABS 0.0 01/07/2021 1742   BASOSABS 0.0 10/14/2016 0844    Hgb A1C Lab Results  Component Value Date   HGBA1C 5.2 11/17/2012           Assessment & Plan:   RTC in 6 months for your annual exam Nicki Reaper, NP

## 2022-04-28 NOTE — Addendum Note (Signed)
Addended by: Kavin Leech E on: 04/28/2022 03:59 PM   Modules accepted: Orders

## 2022-04-28 NOTE — Assessment & Plan Note (Signed)
Controlled on losartan Reinforced DASH diet

## 2022-05-03 ENCOUNTER — Ambulatory Visit: Payer: BC Managed Care – PPO | Admitting: Internal Medicine

## 2022-05-09 ENCOUNTER — Other Ambulatory Visit: Payer: Self-pay | Admitting: Internal Medicine

## 2022-05-10 ENCOUNTER — Ambulatory Visit: Payer: Self-pay | Admitting: *Deleted

## 2022-05-10 NOTE — Telephone Encounter (Signed)
  Chief Complaint: possible elevated BP Symptoms: none- patient was using friend's cuff and got elevated readings. She does not trust that is is purchasing her own cuff today. She plans to monitor her BP and record readings this week for provider. Patient will contact office for appointment if high. Reviewed perimeters and symptoms and she will call office with concerns. Frequency: checked at friend home end of last week Pertinent Negatives: Patient denies blurred vision, chest pain, difficulty breathing, headache, weakness Disposition: [] ED /[] Urgent Care (no appt availability in office) / [] Appointment(In office/virtual)/ []  [facility]/ [] Home Care/ [] Refused Recommended Disposition /[] [place] [facility]/ [x]  Follow-up with PCP Additional Notes: patient will call office for f/u appointment if needed- also advised she can contact provider in [place] if needed.

## 2022-05-10 NOTE — Telephone Encounter (Signed)
Summary: High Blood Pressure   Patient states that she had some high blood pressure readings over the weekend. Patient states she is not sure if the cuff was working because it did not have consistent readings. Patient said she felt fine.     Reason for Disposition . [6] Systolic BP  >= 301 OR Diastolic >= 80 AND [6] taking BP medications  Answer Assessment - Initial Assessment Questions 1. BLOOD PRESSURE: "What is the blood pressure?" "Did you take at least two measurements 5 minutes apart?"     160/93,170/80- estimated  2. ONSET: "When did you take your blood pressure?"     Last week 3. HOW: "How did you take your blood pressure?" (e.g., automatic home BP monitor, visiting nurse)     Automatic cuff-arm 4. HISTORY: "Do you have a history of high blood pressure?"     yes 5. MEDICINES: "Are you taking any medicines for blood pressure?" "Have you missed any doses recently?"     yes 6. OTHER SYMPTOMS: "Do you have any symptoms?" (e.g., blurred vision, chest pain, difficulty breathing, headache, weakness)     No symptoms  Protocols used: Blood Pressure - High-A-AH

## 2022-05-10 NOTE — Telephone Encounter (Signed)
noted 

## 2022-05-11 ENCOUNTER — Ambulatory Visit: Payer: Self-pay | Admitting: *Deleted

## 2022-05-11 NOTE — Patient Outreach (Signed)
  Care Coordination   Follow Up Visit Note   05/11/2022 Name: Carrie Beard MRN: [id number] DOB: 07/14/1955  Carrie Beard is a 66 y.o. year old female who sees Baity, Coralie Keens, NP for primary care. I spoke with  [name] by phone today.  What matters to the patients health and wellness today?  Keeping blood pressure consistently managed.     Goals Addressed             This Visit's Progress    RNCM: Effective Management of HTN   On track    Care Coordination Interventions: BP Readings from Last 3 Encounters:  04/28/22 128/68  11/30/21 (!) 163/95  11/15/21 (!) 178/110    Evaluation of current treatment plan related to hypertension self management and patient's adherence to plan as established by provider. She has had to alter some of her medications due to side effects.  Currently taking 100 mg Losartan. Education and support provided.  Provided education to patient re: stroke prevention, s/s of heart attack and stroke Reviewed medications with patient and discussed importance of compliance. The patient is taking the losartan but has stopped the labetalol.  Discussed plans with patient for ongoing care management follow up and provided patient with direct contact information for care management team Advised patient, providing education and rationale, to monitor blood pressure daily and record, calling PCP for findings outside established parameters.  Was using friend's machine which showed elevated readings.  She has ordered her own from [place] will be delivered tomorrow Reviewed scheduled/upcoming provider appointments including: seen by PCP on 12/21, BP was better at that time but patient report she has been stressed since then, causing an increased.  She was told to notify PCP if blood pressure greater then 160/100.  Scheduled PCP follow up in 6 months, June 2024  Advised patient to discuss changes in sx and sx, questions or concerns with provider Discussed complications of poorly  controlled blood pressure such as heart disease, stroke, circulatory complications, vision complications, kidney impairment, sexual dysfunction Discussed blood pressure trends, state they have remained elevated but she has not been monitoring daily.  Advised to do so, document readings, and take with her to PCP appointments The patient agrees to outreaches from the [facility]. Review of the care coordination services and available resources. The patient knows how to reach the [facility] for changes or new concerns before the next outreach.           SDOH assessments and interventions completed:  No     Care Coordination Interventions:  Yes, provided   Follow up plan: Follow up call scheduled for 2/6    Encounter Outcome:  Pt. Visit Completed   Valente David, RN, MSN, [place] Management Care Management Coordinator [phone]

## 2022-05-11 NOTE — Telephone Encounter (Signed)
Refused Pristiq 50 mg because it's a duplicate request.   #12 with 1 refill was sent on 04/28/2022.

## 2022-05-11 NOTE — Patient Instructions (Signed)
Visit Information  Thank you for taking time to visit with me today. Please don't hesitate to contact me if I can be of assistance to you before our next scheduled telephone appointment.  Following are the goals we discussed today:  Take blood pressure at least daily, record readings.  Share with provider.  Call office if BP is greater than provided parameters.   Our next appointment is by telephone on 2/6  Please call the care guide team at [phone] if you need to cancel or reschedule your appointment.   Please call the Suicide and Crisis Lifeline: 988 call the [place] National Suicide Prevention Lifeline: 315-463-0869 or TTY: 8147398335 TTY 229-694-3052) to talk to a trained counselor call 1-800-273-TALK (toll free, 24 hour hotline) call 911 if you are experiencing a Mental Health or [place] or need someone to talk to.  Patient verbalizes understanding of instructions and care plan provided today and agrees to view in [place]. Active MyChart status and patient understanding of how to access instructions and care plan via MyChart confirmed with patient.     The patient has been provided with contact information for the care management team and has been advised to call with any health related questions or concerns.   Valente David, RN, MSN, [place] Care Management Care Management Coordinator [phone]

## 2022-05-16 ENCOUNTER — Ambulatory Visit: Payer: Self-pay

## 2022-05-16 NOTE — Telephone Encounter (Signed)
Currently have her on losartan 100 mg daily.  Her blood pressure was completely fine at her last visit.  I would recommend follow-up office visit and have her bring her blood pressure cuff for comparison.

## 2022-05-16 NOTE — Telephone Encounter (Signed)
Apt 05/19/2022 at 3:40pm   Thanks,   -Mickel Baas

## 2022-05-16 NOTE — Telephone Encounter (Signed)
    Chief Complaint: Elevated BP readings.Today 170/96.  "She had given me a medication to take twice a day if I needed to and I have started that. I don't remember the name." Pt. Is at work.  Symptoms: No symptoms. Frequency: Weekend reading : 164/91,199/100,189/101, 173/84. Pertinent Negatives: Patient denies  Disposition: [] ED /[] Urgent Care (no appt availability in office) / [] Appointment(In office/virtual)/ []  [facility]/ [] Home Care/ [] Refused Recommended Disposition /[] [place] [facility]/ [x]  Follow-up with PCP Additional Notes: Declines OV, wants advice from PCP.  Answer Assessment - Initial Assessment Questions 1. BLOOD PRESSURE: "What is the blood pressure?" "Did you take at least two measurements 5 minutes apart?"     170/96 2. ONSET: "When did you take your blood pressure?"     Today 3. HOW: "How did you take your blood pressure?" (e.g., automatic home BP monitor, visiting nurse)     Home cuff 4. HISTORY: "Do you have a history of high blood pressure?"     Yes 5. MEDICINES: "Are you taking any medicines for blood pressure?" "Have you missed any doses recently?"     Yes 6. OTHER SYMPTOMS: "Do you have any symptoms?" (e.g., blurred vision, chest pain, difficulty breathing, headache, weakness)     No 7. PREGNANCY: "Is there any chance you are pregnant?" "When was your last menstrual period?"     No  Protocols used: Blood Pressure - High-A-AH

## 2022-05-19 ENCOUNTER — Encounter: Payer: Self-pay | Admitting: Internal Medicine

## 2022-05-19 ENCOUNTER — Ambulatory Visit: Payer: BC Managed Care – PPO | Admitting: Internal Medicine

## 2022-05-19 VITALS — BP 158/92 | HR 63 | Temp 98.2°F | Wt 123.0 lb

## 2022-05-19 DIAGNOSIS — I10 Essential (primary) hypertension: Secondary | ICD-10-CM

## 2022-05-19 MED ORDER — LABETALOL HCL 100 MG PO TABS: 100.0000 mg | ORAL_TABLET | Freq: Three times a day (TID) | ORAL | 0 refills | Status: DC

## 2022-05-19 NOTE — Progress Notes (Signed)
Subjective:    Patient ID: Carrie Beard, female    DOB: [birth year], 67 y.o.   MRN: [id number]  HPI  Patient presents to clinic today with complaint of elevated blood pressures. She has been getting 150/70-190/90 at home.  She has a history of HTN managed on Losartan. She stopped Labetalol in the past because she felt like it made her gain weight. She denies chest pain but does have some intermittent shortness of breath. She attributes this to being out of shape. Her BP today is 158/92.  ECG from 01/2021 reviewed.  Review of Systems  Past Medical History:  Diagnosis Date   Allergy    Thyroid disease     Current Outpatient Medications  Medication Sig Dispense Refill   acetaminophen (TYLENOL) 325 MG tablet Take 325 mg by mouth every 6 (six) hours as needed for moderate pain or headache.     desvenlafaxine (PRISTIQ) 50 MG 24 hr tablet Take 1 tablet (50 mg total) by mouth daily. 90 tablet 1   HYDROcodone-acetaminophen (NORCO/VICODIN) 5-325 MG tablet Take 1 tablet by mouth every 6 (six) hours as needed for moderate pain. 30 tablet 0   influenza vac split quadrivalent PF (FLUARIX QUADRIVALENT) 0.5 ML injection Inject into the muscle. 0.5 mL 0   losartan (COZAAR) 100 MG tablet Take 1 tablet (100 mg total) by mouth daily. 90 tablet 1   methocarbamol (ROBAXIN) 500 MG tablet Take 1 tablet (500 mg total) by mouth daily as needed for muscle spasms. 30 tablet 0   traZODone (DESYREL) 50 MG tablet TAKE 1/2 TO 1 TABLET BY MOUTH EVERY NIGHT AT BEDTIME AS NEEDED FOR SLEEP 90 tablet 1   No current facility-administered medications for this visit.    Allergies  Allergen Reactions   Fire Ant    Tramadol Nausea And Vomiting    Family History  Problem Relation Age of Onset   Hypertension Mother    Hypercholesterolemia Mother    Diabetes Father    Cancer Father        skin, prostate   Heart disease Father    Hyperlipidemia Father    Kidney disease Father    Diabetes Paternal Grandfather      Social History   Socioeconomic History   Marital status: Single    Spouse name: Not on file   Number of children: 0   Years of education: Not on file   Highest education level: Not on file  Occupational History   Occupation: Geographical information systems officer  Tobacco Use   Smoking status: Former    Types: Cigarettes    Quit date: 05/09/1998    Years since quitting: 24.0   Smokeless tobacco: Never  Vaping Use   Vaping Use: Never used  Substance and Sexual Activity   Alcohol use: Not Currently    Comment: social rare   Drug use: No   Sexual activity: Not Currently    Birth control/protection: Abstinence  Other Topics Concern   Not on file  Social History Narrative   Single   No children   Rides horses   Social Determinants of Health   Financial Resource Strain: Not on file  Food Insecurity: No Food Insecurity (01/28/2022)   Hunger Vital Sign    Worried About Running Out of Food in the Last Year: Never true    Ran Out of Food in the Last Year: Never true  Transportation Needs: No Transportation Needs (01/28/2022)   PRAPARE - Hydrologist (Medical): No  Lack of Transportation (Non-Medical): No  Physical Activity: Not on file  Stress: Not on file  Social Connections: Not on file  Intimate Partner Violence: Not on file     Constitutional: Denies fever, malaise, fatigue, headache or abrupt weight changes.  Respiratory: Pt reports intermittent shortness of breath. Denies difficulty breathing,  cough or sputum production.   Cardiovascular: Denies chest pain, chest tightness, palpitations or swelling in the hands or feet.  Neurological: Denies dizziness, difficulty with memory, difficulty with speech or problems with balance and coordination.   No other specific complaints in a complete review of systems (except as listed in HPI above).     Objective:   Physical Exam   BP (!) 158/92 (BP Location: Right Arm, Patient Position: Sitting, Cuff Size:  Normal)   Pulse 63   Temp 98.2 F (36.8 C) (Oral)   Wt 123 lb (55.8 kg)   SpO2 99%   BMI 23.24 kg/m   Wt Readings from Last 3 Encounters:  11/30/21 120 lb (54.4 kg)  10/21/21 118 lb (53.5 kg)  12/22/20 112 lb 9.6 oz (51.1 kg)    General: Appears her stated age, in NAD. Cardiovascular: Normal rate and rhythm. S1,S2 noted.  No murmur, rubs or gallops noted. No JVD or BLE edema.  Pulmonary/Chest: Normal effort and positive vesicular breath sounds. No respiratory distress. No wheezes, rales or ronchi noted.  Musculoskeletal: Normal range of motion. No difficulty with gait.  Neurological: Alert and oriented. Coordination normal.    BMET    Component Value Date/Time   NA 139 10/21/2021 1541   NA 140 10/14/2016 0844   K 3.9 10/21/2021 1541   CL 101 10/21/2021 1541   CO2 28 10/21/2021 1541   GLUCOSE 86 10/21/2021 1541   BUN 24 10/21/2021 1541   BUN 17 10/14/2016 0844   CREATININE 0.90 10/21/2021 1541   CALCIUM 9.5 10/21/2021 1541   GFRNONAA >60 01/07/2021 1742   GFRNONAA 71 10/12/2015 0946   GFRAA 69 10/14/2016 0844   GFRAA 82 10/12/2015 0946    Lipid Panel     Component Value Date/Time   CHOL 253 (H) 10/21/2021 1541   CHOL 220 (H) 10/14/2016 0844   TRIG 50 10/21/2021 1541   HDL 146 10/21/2021 1541   HDL 107 10/14/2016 0844   CHOLHDL 1.7 10/21/2021 1541   VLDL 14.0 09/19/2019 1538   LDLCALC 94 10/21/2021 1541    CBC    Component Value Date/Time   WBC 7.5 10/21/2021 1541   RBC 4.61 10/21/2021 1541   HGB 14.0 10/21/2021 1541   HGB 14.3 10/14/2016 0844   HCT 41.4 10/21/2021 1541   HCT 44.2 10/14/2016 0844   PLT 273 10/21/2021 1541   PLT 268 10/14/2016 0844   MCV 89.8 10/21/2021 1541   MCV 89 10/14/2016 0844   MCH 30.4 10/21/2021 1541   MCHC 33.8 10/21/2021 1541   RDW 13.2 10/21/2021 1541   RDW 14.3 10/14/2016 0844   LYMPHSABS 2.3 01/07/2021 1742   LYMPHSABS 1.8 10/14/2016 0844   MONOABS 0.4 01/07/2021 1742   EOSABS 0.1 01/07/2021 1742   EOSABS 0.1  10/14/2016 0844   BASOSABS 0.0 01/07/2021 1742   BASOSABS 0.0 10/14/2016 0844    Hgb A1C Lab Results  Component Value Date   HGBA1C 5.2 11/17/2012           Assessment & Plan:     RTC in 5 months for annual exam Webb Silversmith, NP

## 2022-05-19 NOTE — Assessment & Plan Note (Addendum)
Remains elevated Continue losartan She has restarted labetalol and increased to 100 mg 3 times daily Update me in 1 to 2 weeks with BP readings by MyChart Consider renal artery ultrasound if blood pressure remains elevated

## 2022-05-19 NOTE — Patient Instructions (Signed)
Hypertension, Adult ?Hypertension is another name for high blood pressure. High blood pressure forces your heart to work harder to pump blood. This can cause problems over time. ?There are two numbers in a blood pressure reading. There is a top number (systolic) over a bottom number (diastolic). It is best to have a blood pressure that is below 120/80. ?What are the causes? ?The cause of this condition is not known. Some other conditions can lead to high blood pressure. ?What increases the risk? ?Some lifestyle factors can make you more likely to develop high blood pressure: ?Smoking. ?Not getting enough exercise or physical activity. ?Being overweight. ?Having too much fat, sugar, calories, or salt (sodium) in your diet. ?Drinking too much alcohol. ?Other risk factors include: ?Having any of these conditions: ?Heart disease. ?Diabetes. ?High cholesterol. ?Kidney disease. ?Obstructive sleep apnea. ?Having a family history of high blood pressure and high cholesterol. ?Age. The risk increases with age. ?Stress. ?What are the signs or symptoms? ?High blood pressure may not cause symptoms. Very high blood pressure (hypertensive crisis) may cause: ?Headache. ?Fast or uneven heartbeats (palpitations). ?Shortness of breath. ?Nosebleed. ?Vomiting or feeling like you may vomit (nauseous). ?Changes in how you see. ?Very bad chest pain. ?Feeling dizzy. ?Seizures. ?How is this treated? ?This condition is treated by making healthy lifestyle changes, such as: ?Eating healthy foods. ?Exercising more. ?Drinking less alcohol. ?Your doctor may prescribe medicine if lifestyle changes do not help enough and if: ?Your top number is above 130. ?Your bottom number is above 80. ?Your personal target blood pressure may vary. ?Follow these instructions at home: ?Eating and drinking ? ?If told, follow the DASH eating plan. To follow this plan: ?Fill one half of your plate at each meal with fruits and vegetables. ?Fill one fourth of your plate  at each meal with whole grains. Whole grains include whole-wheat pasta, brown rice, and whole-grain bread. ?Eat or drink low-fat dairy products, such as skim milk or low-fat yogurt. ?Fill one fourth of your plate at each meal with low-fat (lean) proteins. Low-fat proteins include fish, chicken without skin, eggs, beans, and tofu. ?Avoid fatty meat, cured and processed meat, or chicken with skin. ?Avoid pre-made or processed food. ?Limit the amount of salt in your diet to less than 1,500 mg each day. ?Do not drink alcohol if: ?Your doctor tells you not to drink. ?You are pregnant, may be pregnant, or are planning to become pregnant. ?If you drink alcohol: ?Limit how much you have to: ?0-1 drink a day for women. ?0-2 drinks a day for men. ?Know how much alcohol is in your drink. In the [place] one drink equals one 12 oz bottle of beer (355 mL), one 5 oz glass of wine (148 mL), or one 1? oz glass of hard liquor (44 mL). ?Lifestyle ? ?Work with your doctor to stay at a healthy weight or to lose weight. Ask your doctor what the best weight is for you. ?Get at least 30 minutes of exercise that causes your heart to beat faster (aerobic exercise) most days of the week. This may include walking, swimming, or biking. ?Get at least 30 minutes of exercise that strengthens your muscles (resistance exercise) at least 3 days a week. This may include lifting weights or doing Pilates. ?Do not smoke or use any products that contain nicotine or tobacco. If you need help quitting, ask your doctor. ?Check your blood pressure at home as told by your doctor. ?Keep all follow-up visits. ?Medicines ?Take over-the-counter and prescription medicines   only as told by your doctor. Follow directions carefully. ?Do not skip doses of blood pressure medicine. The medicine does not work as well if you skip doses. Skipping doses also puts you at risk for problems. ?Ask your doctor about side effects or reactions to medicines that you should watch  for. ?Contact a doctor if: ?You think you are having a reaction to the medicine you are taking. ?You have headaches that keep coming back. ?You feel dizzy. ?You have swelling in your ankles. ?You have trouble with your vision. ?Get help right away if: ?You get a very bad headache. ?You start to feel mixed up (confused). ?You feel weak or numb. ?You feel faint. ?You have very bad pain in your: ?Chest. ?Belly (abdomen). ?You vomit more than once. ?You have trouble breathing. ?These symptoms may be an emergency. Get help right away. Call 911. ?Do not wait to see if the symptoms will go away. ?Do not drive yourself to the hospital. ?Summary ?Hypertension is another name for high blood pressure. ?High blood pressure forces your heart to work harder to pump blood. ?For most people, a normal blood pressure is less than 120/80. ?Making healthy choices can help lower blood pressure. If your blood pressure does not get lower with healthy choices, you may need to take medicine. ?This information is not intended to replace advice given to you by your health care provider. Make sure you discuss any questions you have with your health care provider. ?Document Revised: 02/11/2021 Document Reviewed: 02/11/2021 ?Elsevier Patient Education ? 2023 Elsevier Inc. ? ?

## 2022-06-06 ENCOUNTER — Ambulatory Visit: Payer: Self-pay | Admitting: *Deleted

## 2022-06-06 DIAGNOSIS — I1A0 Resistant hypertension: Secondary | ICD-10-CM

## 2022-06-06 NOTE — Telephone Encounter (Signed)
Summary: elevated bp   Pt called saying she is on a new blood pressure medication and it has been making her blood pressure run a little high.  CB#  [id number]       Chief Complaint: side effects from medication elevated BP  Symptoms: feet swelling, after first week of taking Labetolol 100 mg . Now feet better , tolerable. BP this am 165/92. Reports new medication not helping BP decrease much.  Frequency: 05/26/22 Pertinent Negatives: Patient denies chest pain no difficulty breathing no weakness either side of body. No headache, no dizziness.  Disposition: [] ED /[] Urgent Care (no appt availability in office) / [] Appointment(In office/virtual)/ []  [facility]/ [] Home Care/ [] Refused Recommended Disposition /[] [facility]/ [x]  Follow-up with PCP Additional Notes:   Requesting if medication change needed. Does patient need to continue taking Labetolol as prescribed. Recommended drinking more water and patient reports she drinks a lot of water already. Please advise if appt needed and medication recommendations.     Reason for Disposition  Systolic BP  >= 458 OR Diastolic >= 099  Answer Assessment - Initial Assessment Questions 1. BLOOD PRESSURE: "What is the blood pressure?" "Did you take at least two measurements 5 minutes apart?"     This am BP 165/92 2. ONSET: "When did you take your blood pressure?"     This am  3. HOW: "How did you take your blood pressure?" (e.g., automatic home BP monitor, visiting nurse)     na 4. HISTORY: "Do you have a history of high blood pressure?"     Yes  5. MEDICINES: "Are you taking any medicines for blood pressure?" "Have you missed any doses recently?"     Yes not missed doses 6. OTHER SYMPTOMS: "Do you have any symptoms?" (e.g., blurred vision, chest pain, difficulty breathing, headache, weakness)     Feet swelling  after 1 week taking new med labetolol 100 mg swelling better now  7. PREGNANCY: "Is there any chance you are  pregnant?" "When was your last menstrual period?"     na  Protocols used: Blood Pressure - High-A-AH

## 2022-06-07 NOTE — Telephone Encounter (Signed)
I have had a hard time getting her blood pressure controlled.  How would she feel about referral to cardiology for this?

## 2022-06-08 NOTE — Telephone Encounter (Signed)
Pt advised.  She would like to do the renal U/S first if you are okay with that.    Thanks,   -Mickel Baas

## 2022-06-09 NOTE — Telephone Encounter (Signed)
Ultrasound ordered

## 2022-06-09 NOTE — Addendum Note (Signed)
Addended by: Jearld Fenton on: 06/09/2022 07:54 AM   Modules accepted: Orders

## 2022-06-14 ENCOUNTER — Ambulatory Visit: Payer: Self-pay | Admitting: *Deleted

## 2022-06-14 NOTE — Patient Outreach (Signed)
  Care Coordination   06/14/2022 Name: Sheli Dorin MRN: [id number] DOB: Aug 17, 1955   Care Coordination Outreach Attempts:  An unsuccessful telephone outreach was attempted for a scheduled appointment today.  Follow Up Plan:  Additional outreach attempts will be made to offer the patient care coordination information and services.   Encounter Outcome:  No Answer   Care Coordination Interventions:  No, not indicated    Valente David, RN, MSN, [facility] Care Management Care Management Coordinator [phone]

## 2022-06-15 ENCOUNTER — Ambulatory Visit: Payer: BC Managed Care – PPO

## 2022-06-17 ENCOUNTER — Telehealth: Payer: Self-pay | Admitting: *Deleted

## 2022-06-17 NOTE — Progress Notes (Signed)
  Care Coordination Note  06/17/2022 Name: Carrie Beard MRN: [id number] DOB: 11/14/1955  Carrie Beard is a 67 y.o. year old female who is a primary care patient of Jearld Fenton, NP and is actively engaged with the care management team. I reached out to [name] by phone today to assist with re-scheduling a follow up visit with the RN Case Manager  Follow up plan: Unsuccessful telephone outreach attempt made. A HIPAA compliant phone message was left for the patient providing contact information and requesting a return call.   Julian Hy, [place] Direct Dial: [phone]

## 2022-07-12 NOTE — Progress Notes (Signed)
  Care Coordination Note  07/12/2022 Name: [name] MRN: [id number] DOB: 1955-06-04  Carrie Beard is a 67 y.o. year old female who is a primary care patient of Jearld Fenton, NP and is actively engaged with the care management team. I reached out to [name] by phone today to assist with re-scheduling a follow up visit with the RN Case Manager  Follow up plan: Telephone appointment with care management team member scheduled for: 07/14/2022  Julian Hy, [place] Direct Dial: [phone]

## 2022-07-14 ENCOUNTER — Ambulatory Visit: Payer: Self-pay | Admitting: *Deleted

## 2022-07-14 NOTE — Patient Outreach (Signed)
  Care Coordination   Follow Up Visit Note   07/14/2022 Name: Carrie Beard MRN: [id number] DOB: Sep 30, 1955  Carrie Beard is a 67 y.o. year old female who sees Baity, Coralie Keens, NP for primary care. I spoke with  [name] by phone today.  What matters to the patients health and wellness today?  State she is doing well, blood pressure now controlled. Denies any urgent concerns, encouraged to contact this care manager with questions.     Goals Addressed             This Visit's Progress    COMPLETED: RNCM: Effective Management of HTN   On track    Care Coordination Interventions: BP Readings from Last 3 Encounters:  07/14/22 132/70  05/19/22 (!) 158/92  04/28/22 128/68    Evaluation of current treatment plan related to hypertension self management and patient's adherence to plan as established by provider. Provided education to patient re: stroke prevention, s/s of heart attack and stroke Reviewed medications with patient and discussed importance of compliance. 3/7 - State she is completely out of Losartan.  State she called pharmacy on Tuesday to request refill, have not heard from them.  Per chart there is a refill on medication.  She will call pharmacy and if unable to get medication, will call this care manager back.  Discussed plans with patient for ongoing care management follow up and provided patient with direct contact information for care management team Advised patient, providing education and rationale, to monitor blood pressure daily and record, calling PCP for findings outside established parameters.   Reviewed scheduled/upcoming provider appointments including: Scheduled PCP follow up in 6 months, June 2024  Advised patient to discuss changes in sx and sx, questions or concerns with provider Discussed complications of poorly controlled blood pressure such as heart disease, stroke, circulatory complications, vision complications, kidney impairment, sexual  dysfunction Discussed blood pressure trends, state they have remained elevated but she has not been monitoring daily.  Advised to do so, document readings, and take with her to PCP appointments The patient agrees to outreaches from the [facility]. Review of the care coordination services and available resources. The patient knows how to reach the [facility] for changes or new concerns before the next outreach.           SDOH assessments and interventions completed:  No     Care Coordination Interventions:  Yes, provided   Interventions Today    Flowsheet Row Most Recent Value  Chronic Disease   Chronic disease during today's visit Hypertension (HTN)  General Interventions   General Interventions Discussed/Reviewed General Interventions Reviewed, Doctor Visits  Doctor Visits Discussed/Reviewed Doctor Visits Reviewed, PCP  PCP/Specialist Visits Compliance with follow-up visit  Pharmacy Interventions   Pharmacy Dicussed/Reviewed Medication Adherence, Pharmacy Topics Reviewed        Follow up plan: No further intervention required.   Encounter Outcome:  Pt. Visit Completed   Valente David, RN, MSN, [facility] Care Management Care Management Coordinator [phone]

## 2022-07-14 NOTE — Patient Instructions (Signed)
Visit Information  Thank you for taking time to visit with me today. Please don't hesitate to contact me if I can be of assistance to you.  Following are the goals we discussed today:  Continue monitoring blood pressure. Call pharmacy regarding Losartan, if unable to get, call this care manager and/or PCP office.   Please call the Suicide and Crisis Lifeline: 988 call the [place] National Suicide Prevention Lifeline: (212)258-3786 or TTY: 731-279-9137 TTY (708) 545-2547) to talk to a trained counselor call 1-800-273-TALK (toll free, 24 hour hotline) call 911 if you are experiencing a Mental Health or [place] or need someone to talk to.  Patient verbalizes understanding of instructions and care plan provided today and agrees to view in [place]. Active MyChart status and patient understanding of how to access instructions and care plan via MyChart confirmed with patient.     The patient has been provided with contact information for the care management team and has been advised to call with any health related questions or concerns.   [place] Management Care Management Coordinator [phone]

## 2022-07-15 IMAGING — CT CT CERVICAL SPINE W/O CM
3 of 4 series · 9 of 33 positions shown, 10 images · non-contrast
Comparison: None.

CLINICAL DATA: Mixed trauma.

EXAM:
CT HEAD WITHOUT CONTRAST
CT MAXILLOFACIAL WITHOUT CONTRAST
CT CERVICAL SPINE WITHOUT CONTRAST
TECHNIQUE: Multidetector CT imaging of the head, cervical spine, and
maxillofacial structures were performed using the standard protocol
without intravenous contrast. Multiplanar CT image reconstructions
of the cervical spine and maxillofacial structures were also
generated.

[Series 4: c_spine 2.0 sag bone · sagittal · 0.24mm/px · 5 of 50 slices shown]
[im 17/50  bone]
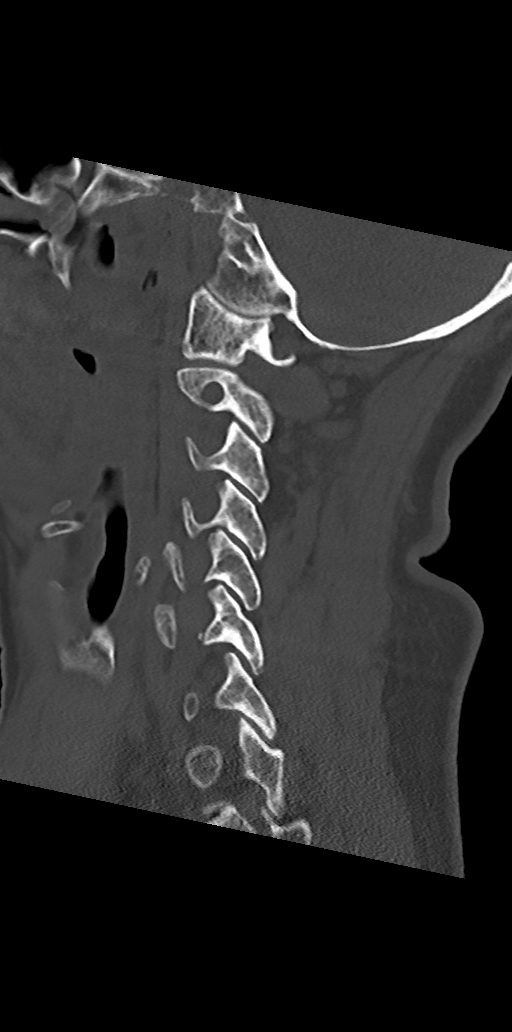
[im 21/50  bone]
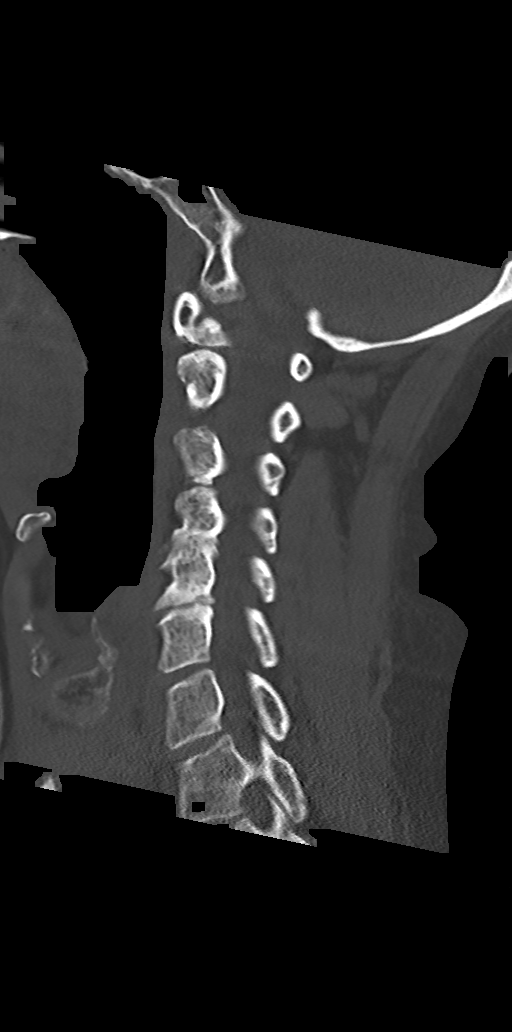
[im 25/50  bone]
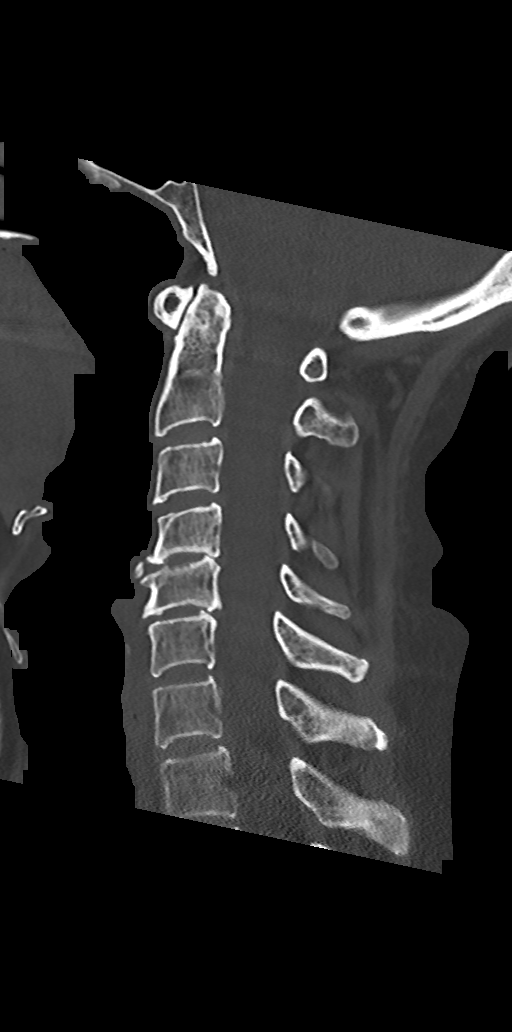
[im 29/50  bone]
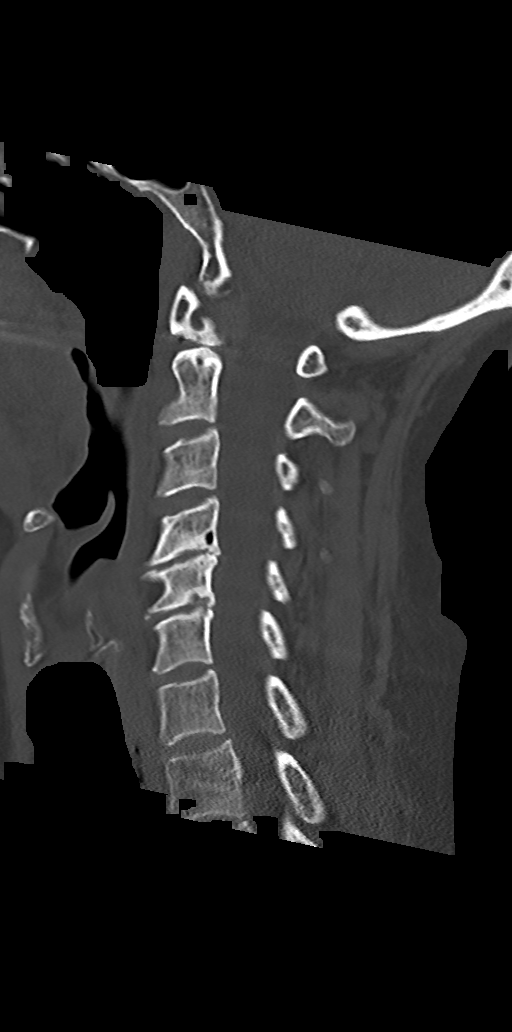
[im 33/50  bone]
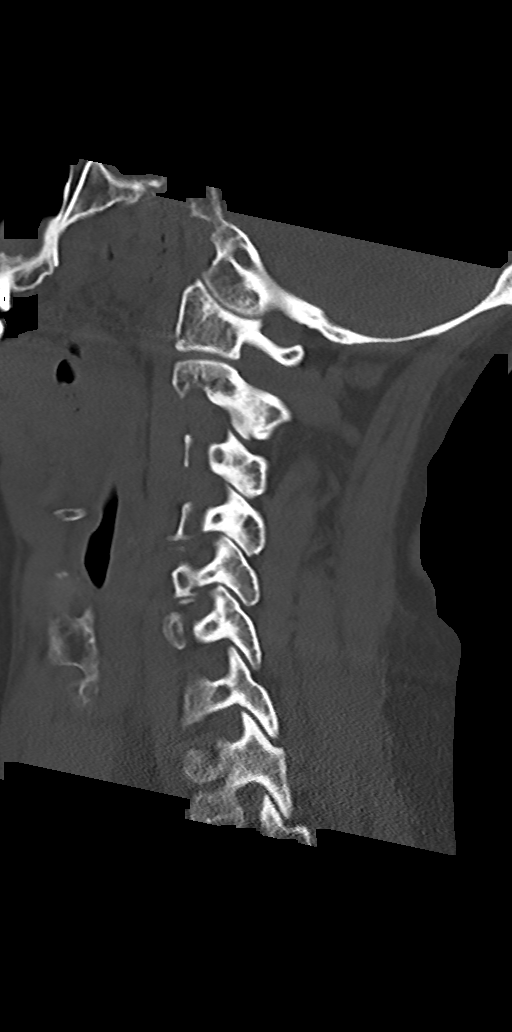

[Series 5: c_spine 2.0 cor bone · coronal · 0.20mm/px · 3 of 42 slices shown]
[im 9/42  bone]
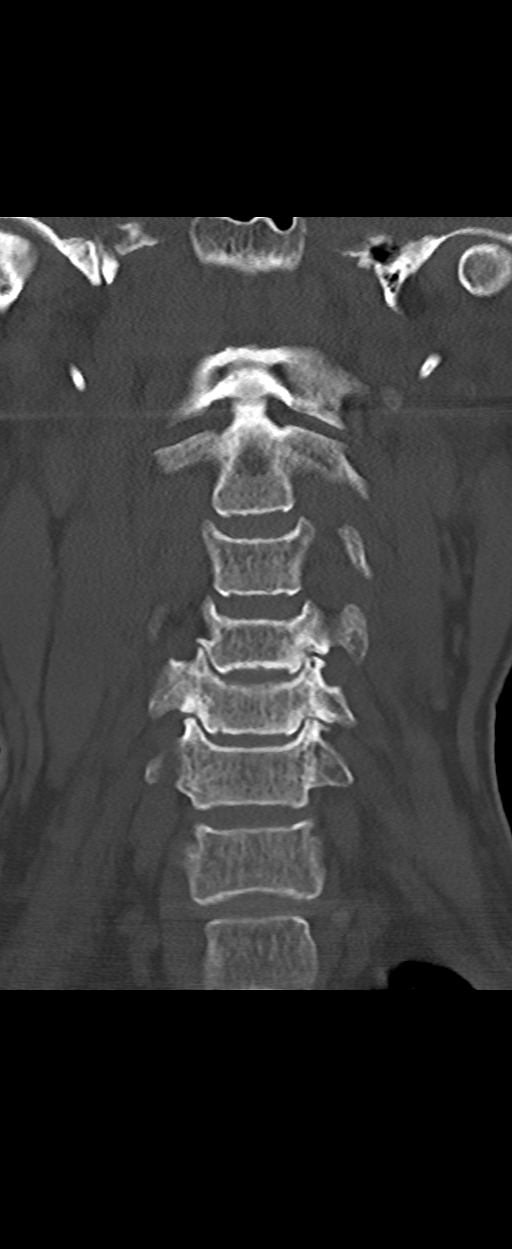
[im 17/42  bone]
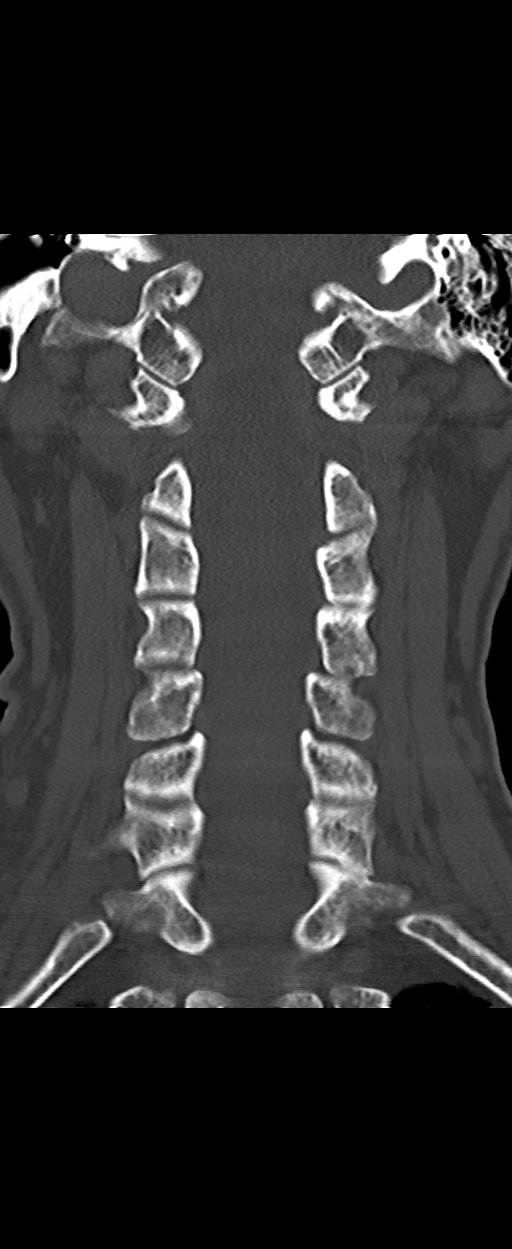
[im 25/42  bone]
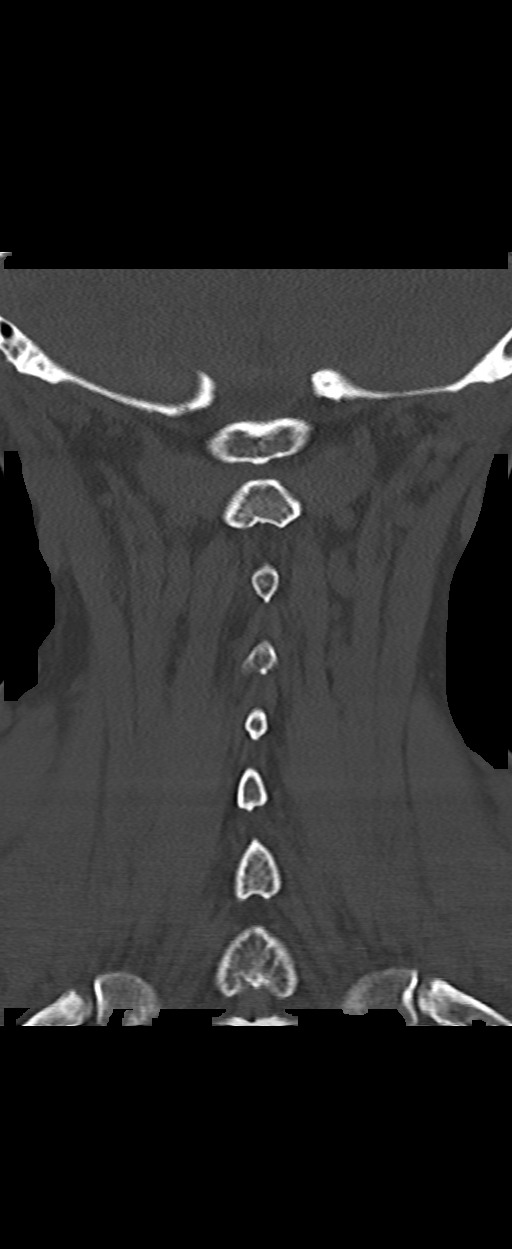

[Series 6: c_spine 2.0 st · axial · 0.26mm/px · z∈[-252,-252]mm · 1 of 73 slices shown, 2 images]
[im 37/73  soft-tissue]
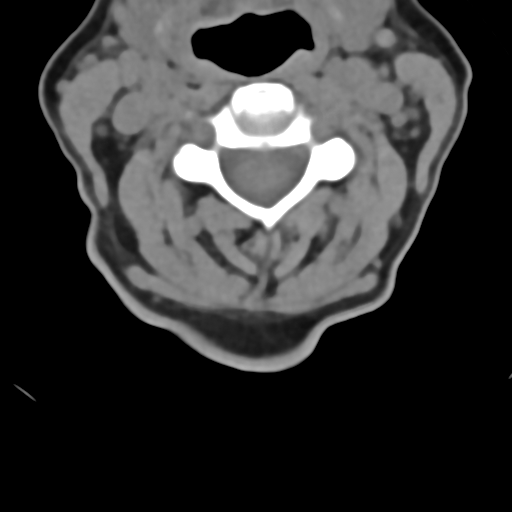
[im 37/73  bone]
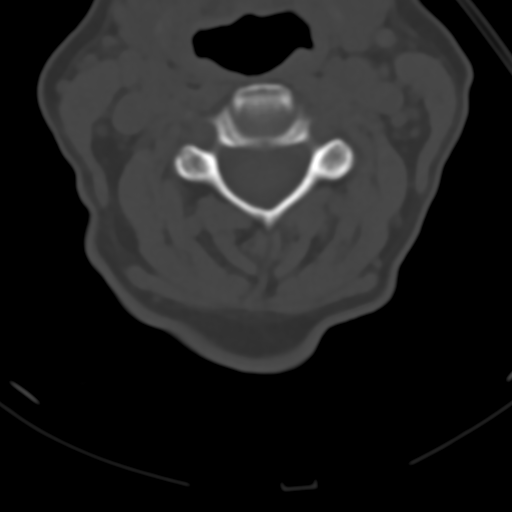

[9 of 33 positions shown; findings below may reference images not displayed]

FINDINGS: CT HEAD FINDINGS

Brain: The ventricles and sulci appropriate size for patient's age.
The gray-white matter discrimination is preserved. There is no acute
intracranial hemorrhage. No mass effect or midline shift. No
extra-axial fluid collection.

Vascular: No hyperdense vessel or unexpected calcification.

Skull: Normal. Negative for fracture or focal lesion.

Other: Mild mucoperiosteal thickening of paranasal sinuses no
air-fluid level. The mastoid air cells are clear.

CT MAXILLOFACIAL FINDINGS

Osseous: Minimally depressed fracture of the left nasal bone. No
other acute fracture. No mandibular subluxation.

Orbits: The globes and retro-orbital fat are preserved.

Sinuses: There is mild mucoperiosteal thickening of paranasal
sinuses. No air-fluid

Soft tissues: There is mild soft tissue swelling over the nose. No
large hematoma.

CT CERVICAL SPINE FINDINGS

Alignment: No acute subluxation.

Skull base and vertebrae: No acute fracture.

Soft tissues and spinal canal: No prevertebral fluid or swelling. No
visible canal hematoma.

Disc levels:  Multilevel degenerative changes

Upper chest: Negative.

Other: Bilateral carotid bulb calcified plaques.
IMPRESSION: 1. No acute intracranial pathology.
2. No acute/traumatic cervical spine pathology.
3. Minimally depressed fracture of the left nasal bone.

## 2022-07-15 IMAGING — CT CT MAXILLOFACIAL W/O CM
3 series · 15 of 47 positions shown, 18 images · non-contrast
Comparison: None.

CLINICAL DATA: Mixed trauma.

EXAM:
CT HEAD WITHOUT CONTRAST
CT MAXILLOFACIAL WITHOUT CONTRAST
CT CERVICAL SPINE WITHOUT CONTRAST
TECHNIQUE: Multidetector CT imaging of the head, cervical spine, and
maxillofacial structures were performed using the standard protocol
without intravenous contrast. Multiplanar CT image reconstructions
of the cervical spine and maxillofacial structures were also
generated.

[Series 3: facialbone 2.0 st · axial · 0.38mm/px · z∈[-261,-121]mm · 9 of 82 slices shown, 12 images]
[im 6/82  brain]
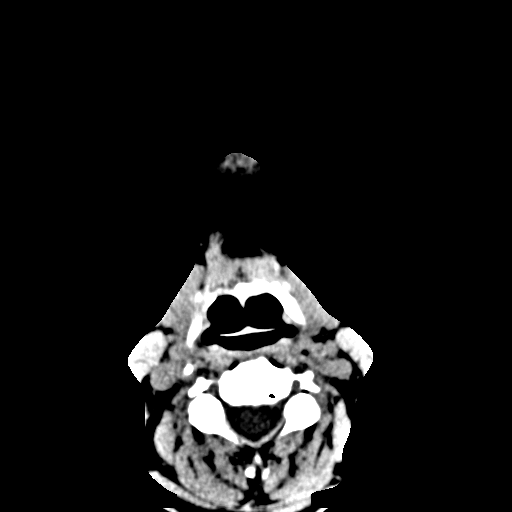
[im 6/82  bone]
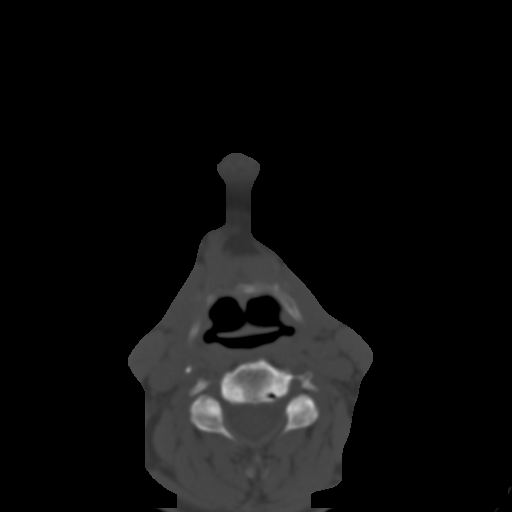
[im 14/82  bone]
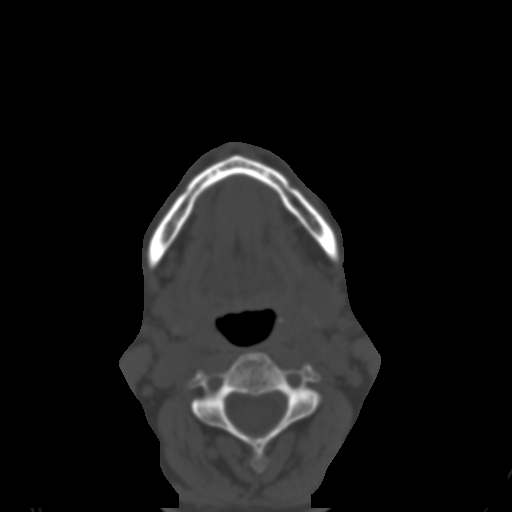
[im 23/82  bone]
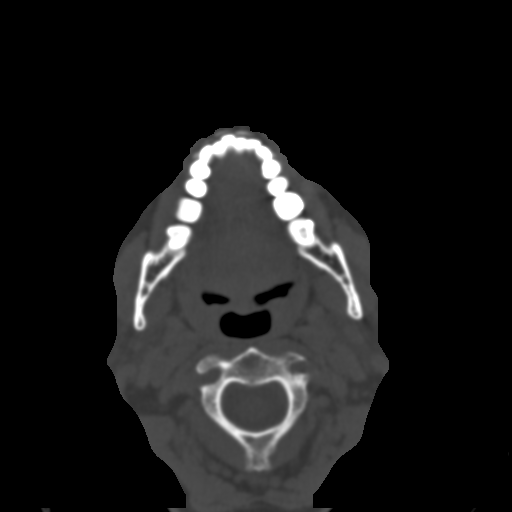
[im 31/82  bone]
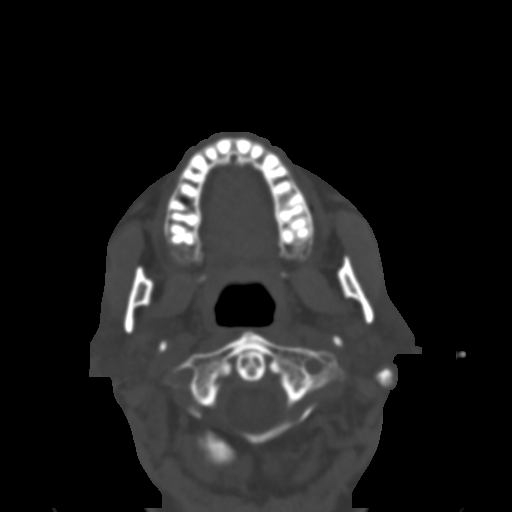
[im 42/82  brain]
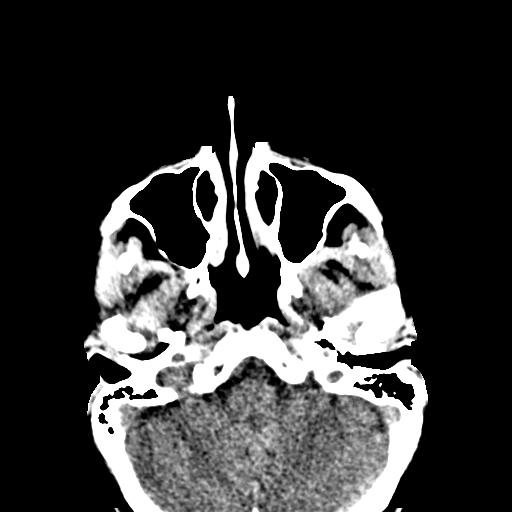
[im 42/82  bone]
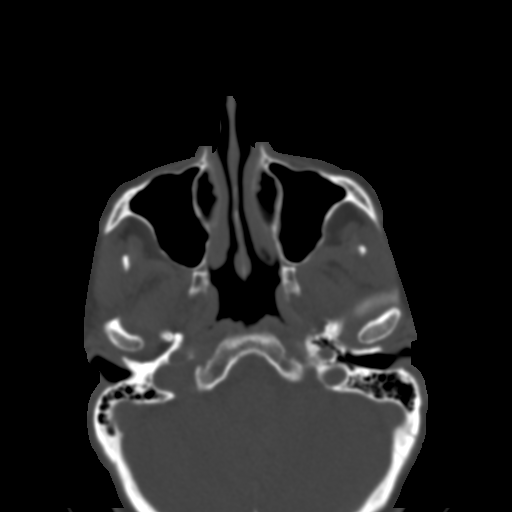
[im 51/82  bone]
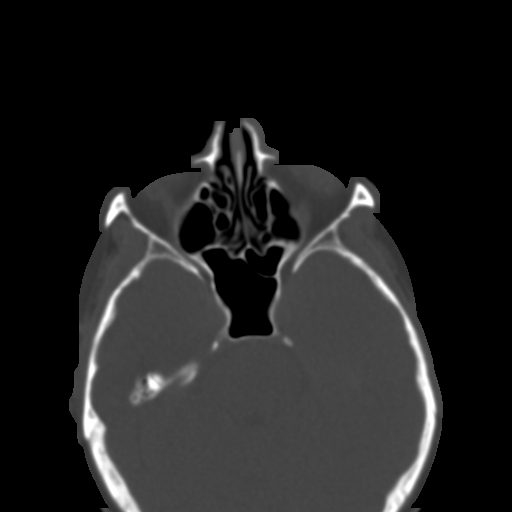
[im 59/82  bone]
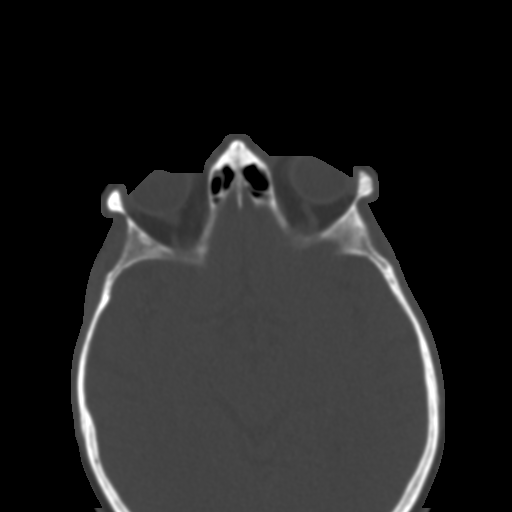
[im 68/82  bone]
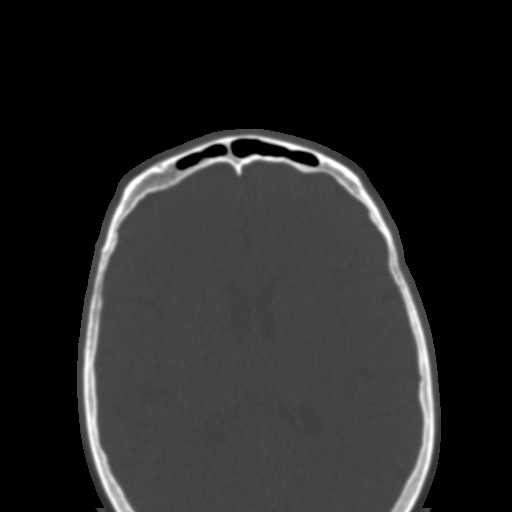
[im 76/82  brain]
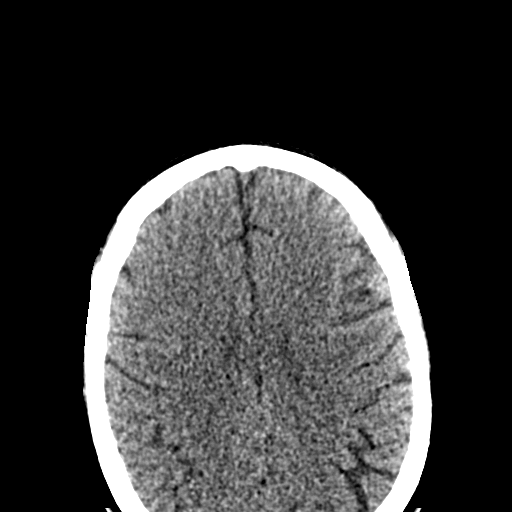
[im 76/82  bone]
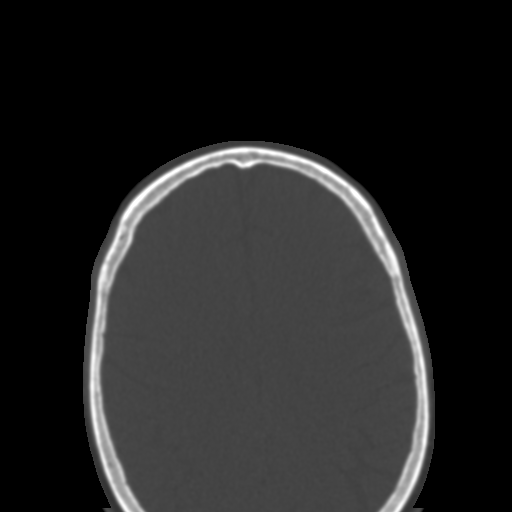

[Series 5: facialbone 2.0 cor st · coronal · 0.34mm/px · 3 of 80 slices shown]
[im 27/80  bone]
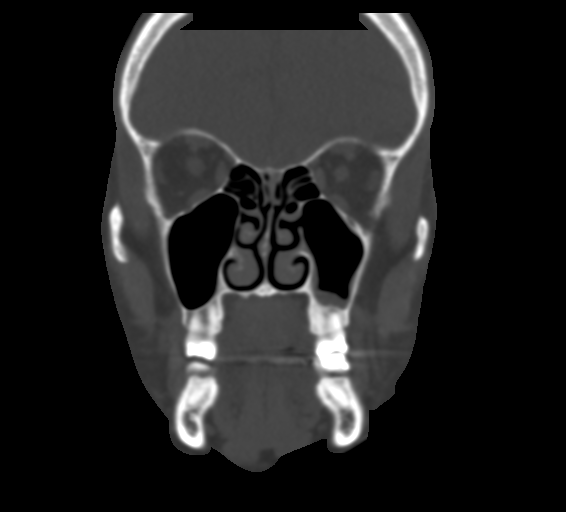
[im 36/80  bone]
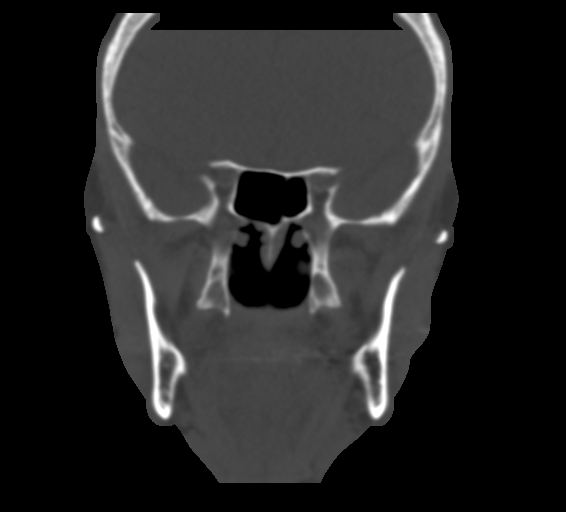
[im 44/80  bone]
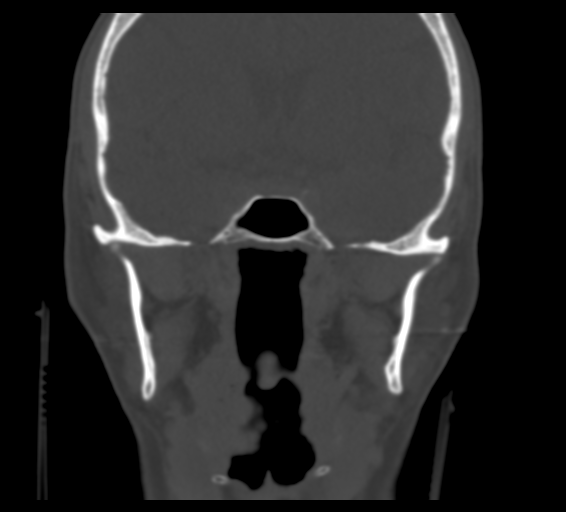

[Series 7: facialbone 2.0 sag st · sagittal · 0.31mm/px · 3 of 74 slices shown]
[im 25/74  bone]
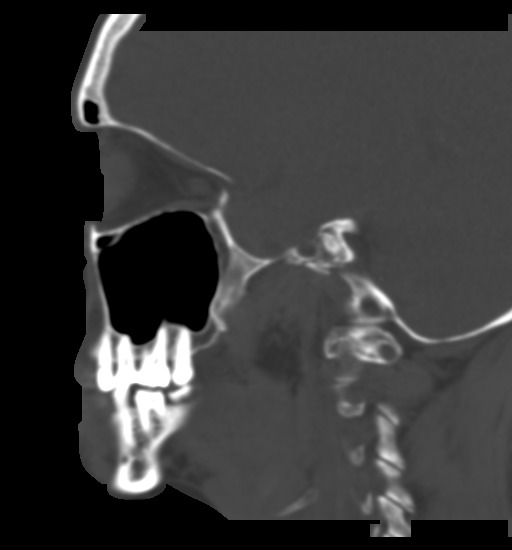
[im 37/74  bone]
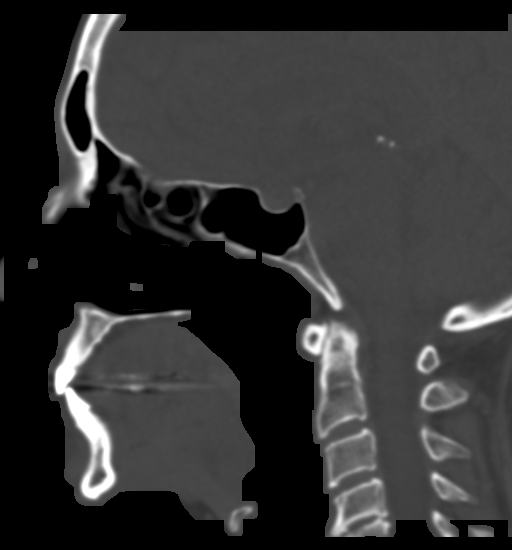
[im 49/74  bone]
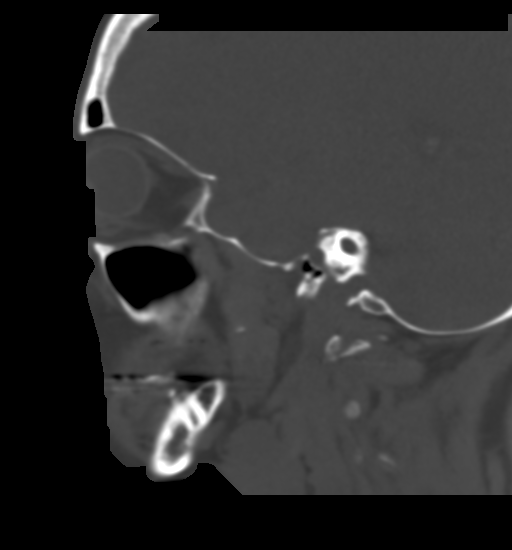

[15 of 47 positions shown; findings below may reference images not displayed]

FINDINGS: CT HEAD FINDINGS

Brain: The ventricles and sulci appropriate size for patient's age.
The gray-white matter discrimination is preserved. There is no acute
intracranial hemorrhage. No mass effect or midline shift. No
extra-axial fluid collection.

Vascular: No hyperdense vessel or unexpected calcification.

Skull: Normal. Negative for fracture or focal lesion.

Other: Mild mucoperiosteal thickening of paranasal sinuses no
air-fluid level. The mastoid air cells are clear.

CT MAXILLOFACIAL FINDINGS

Osseous: Minimally depressed fracture of the left nasal bone. No
other acute fracture. No mandibular subluxation.

Orbits: The globes and retro-orbital fat are preserved.

Sinuses: There is mild mucoperiosteal thickening of paranasal
sinuses. No air-fluid

Soft tissues: There is mild soft tissue swelling over the nose. No
large hematoma.

CT CERVICAL SPINE FINDINGS

Alignment: No acute subluxation.

Skull base and vertebrae: No acute fracture.

Soft tissues and spinal canal: No prevertebral fluid or swelling. No
visible canal hematoma.

Disc levels:  Multilevel degenerative changes

Upper chest: Negative.

Other: Bilateral carotid bulb calcified plaques.
IMPRESSION: 1. No acute intracranial pathology.
2. No acute/traumatic cervical spine pathology.
3. Minimally depressed fracture of the left nasal bone.

## 2022-07-16 ENCOUNTER — Other Ambulatory Visit: Payer: Self-pay | Admitting: Internal Medicine

## 2022-07-18 NOTE — Telephone Encounter (Signed)
Unable to refill per protocol, Rx request is too soon. Last refill 04/28/22 for 90 and 1 refill.  Requested Prescriptions  Pending Prescriptions Disp Refills   losartan (COZAAR) 100 MG tablet [Pharmacy Med Name: LOSARTAN POTASSIUM 100 MG TAB] 90 tablet 1    Sig: TAKE 1 TABLET BY MOUTH DAILY     Cardiovascular:  Angiotensin Receptor Blockers Failed - 07/16/2022  6:52 AM      Failed - Cr in normal range and within 180 days    Creat  Date Value Ref Range Status  10/21/2021 0.90 0.50 - 1.05 mg/dL Final         Failed - K in normal range and within 180 days    Potassium  Date Value Ref Range Status  10/21/2021 3.9 3.5 - 5.3 mmol/L Final         Passed - Patient is not pregnant      Passed - Last BP in normal range    BP Readings from Last 1 Encounters:  07/14/22 132/70         Passed - Valid encounter within last 6 months    Recent Outpatient Visits           2 months ago Primary hypertension   [place], Coralie Keens, NP   2 months ago Primary hypertension   [place], Coralie Keens, NP   7 months ago Primary hypertension   [facility], Coralie Keens, NP   9 months ago Encounter for general adult medical examination with abnormal findings   [facility] [place], Coralie Keens, NP   1 year ago Alcohol abuse   [facility] [place], Coralie Keens, NP       Future Appointments             In 3 months Baity, Coralie Keens, NP [facility], [facility]

## 2022-08-17 ENCOUNTER — Ambulatory Visit: Payer: BC Managed Care – PPO

## 2022-08-18 ENCOUNTER — Ambulatory Visit: Payer: BC Managed Care – PPO

## 2022-08-22 ENCOUNTER — Ambulatory Visit (INDEPENDENT_AMBULATORY_CARE_PROVIDER_SITE_OTHER): Payer: BC Managed Care – PPO

## 2022-08-22 DIAGNOSIS — Z23 Encounter for immunization: Secondary | ICD-10-CM

## 2022-08-22 MED ORDER — LABETALOL HCL 100 MG PO TABS: 100.0000 mg | ORAL_TABLET | Freq: Three times a day (TID) | ORAL | 1 refills | Status: AC

## 2022-08-24 ENCOUNTER — Other Ambulatory Visit: Payer: Self-pay | Admitting: Internal Medicine

## 2022-08-25 NOTE — Telephone Encounter (Signed)
Requested Prescriptions  Refused Prescriptions Disp Refills   labetalol (NORMODYNE) 100 MG tablet [Pharmacy Med Name: LABETALOL HCL 100 MG TABLET] 270 tablet 1    Sig: TAKE ONE TABLET BY MOUTH THREE TIMES A DAY     Cardiovascular:  Beta Blockers Passed - 08/24/2022  6:22 AM      Passed - Last BP in normal range    BP Readings from Last 1 Encounters:  07/14/22 132/70         Passed - Last Heart Rate in normal range    Pulse Readings from Last 1 Encounters:  05/19/22 63         Passed - Valid encounter within last 6 months    Recent Outpatient Visits           3 months ago Primary hypertension   [place] [facility] [place], Salvadore Oxford, NP   3 months ago Primary hypertension   [place] [facility] [place], Salvadore Oxford, NP   8 months ago Primary hypertension   [place] [facility] [place], Salvadore Oxford, NP   10 months ago Encounter for general adult medical examination with abnormal findings   [place] [facility] [place], Salvadore Oxford, NP   1 year ago Alcohol abuse   [place] [facility] [place], Salvadore Oxford, NP       Future Appointments             In 2 months Baity, Salvadore Oxford, NP  [facility], [facility]

## 2022-10-28 ENCOUNTER — Encounter: Payer: BC Managed Care – PPO | Admitting: Internal Medicine

## 2022-11-04 ENCOUNTER — Other Ambulatory Visit: Payer: Self-pay | Admitting: Internal Medicine

## 2022-11-06 ENCOUNTER — Other Ambulatory Visit: Payer: Self-pay | Admitting: Internal Medicine

## 2022-11-07 NOTE — Telephone Encounter (Signed)
Requested medication (s) are due for refill today: yes   Requested medication (s) are on the active medication list: yes   Last refill:  04/28/22 #90 1 refills   Future visit scheduled: no   Notes to clinic:  protocol failed last labs 10/21/21. Do you want to continue refills?     Requested Prescriptions  Pending Prescriptions Disp Refills   desvenlafaxine (PRISTIQ) 50 MG 24 hr tablet [Pharmacy Med Name: DESVENLAFAXINE SUCCNT ER 50 MG] 90 tablet 1    Sig: TAKE 1 TABLET BY MOUTH DAILY     Psychiatry: Antidepressants - SNRI - desvenlafaxine & venlafaxine Failed - 11/04/2022  6:23 AM      Failed - Cr in normal range and within 360 days    Creat  Date Value Ref Range Status  10/21/2021 0.90 0.50 - 1.05 mg/dL Final         Failed - Lipid Panel in normal range within the last 12 months    Cholesterol, Total  Date Value Ref Range Status  10/14/2016 220 (H) 100 - 199 mg/dL Final   Cholesterol  Date Value Ref Range Status  10/21/2021 253 (H) <200 mg/dL Final   LDL Cholesterol (Calc)  Date Value Ref Range Status  10/21/2021 94 mg/dL (calc) Final    Comment:    Reference range: <100 . Desirable range <100 mg/dL for primary prevention;   <70 mg/dL for patients with CHD or diabetic patients  with > or = 2 CHD risk factors. Marland Kitchen LDL-C is now calculated using the Martin-Hopkins  calculation, which is a validated novel method providing  better accuracy than the Friedewald equation in the  estimation of LDL-C.  Horald Pollen et al. [place] Ahr. 1610;960(45): 2061-2068  (http://education.[facility].com/faq/FAQ164)    HDL  Date Value Ref Range Status  10/21/2021 146 > OR = 50 mg/dL Final  40/98/1191 478 >29 mg/dL Final   Triglycerides  Date Value Ref Range Status  10/21/2021 50 <150 mg/dL Final         Passed - Completed PHQ-2 or PHQ-9 in the last 360 days      Passed - Last BP in normal range    BP Readings from Last 1 Encounters:  07/14/22 132/70         Passed - Valid  encounter within last 6 months    Recent Outpatient Visits           5 months ago Primary hypertension   [place] [facility] [place], Salvadore Oxford, NP   6 months ago Primary hypertension   [place] [facility] [place], Salvadore Oxford, NP   11 months ago Primary hypertension   [place] [facility] [place], Salvadore Oxford, NP   1 year ago Encounter for general adult medical examination with abnormal findings   [place] [facility] [place], Salvadore Oxford, NP   1 year ago Alcohol abuse   [place] [facility] [place], Salvadore Oxford, [place]

## 2022-11-08 NOTE — Telephone Encounter (Signed)
OV within protocol.  Requested Prescriptions  Pending Prescriptions Disp Refills   desvenlafaxine (PRISTIQ) 50 MG 24 hr tablet [Pharmacy Med Name: DESVENLAFAXINE SUCCNT ER 50 MG] 90 tablet 0    Sig: TAKE 1 TABLET BY MOUTH DAILY     Psychiatry: Antidepressants - SNRI - desvenlafaxine & venlafaxine Failed - 11/06/2022 12:06 PM      Failed - Cr in normal range and within 360 days    Creat  Date Value Ref Range Status  10/21/2021 0.90 0.50 - 1.05 mg/dL Final         Failed - Lipid Panel in normal range within the last 12 months    Cholesterol, Total  Date Value Ref Range Status  10/14/2016 220 (H) 100 - 199 mg/dL Final   Cholesterol  Date Value Ref Range Status  10/21/2021 253 (H) <200 mg/dL Final   LDL Cholesterol (Calc)  Date Value Ref Range Status  10/21/2021 94 mg/dL (calc) Final    Comment:    Reference range: <100 . Desirable range <100 mg/dL for primary prevention;   <70 mg/dL for patients with CHD or diabetic patients  with > or = 2 CHD risk factors. Marland Kitchen LDL-C is now calculated using the Martin-Hopkins  calculation, which is a validated novel method providing  better accuracy than the Friedewald equation in the  estimation of LDL-C.  Horald Pollen et al. Lenox Ahr. 1610;960(45): 2061-2068  (http://education.[facility].com/faq/FAQ164)    HDL  Date Value Ref Range Status  10/21/2021 146 > OR = 50 mg/dL Final  40/98/1191 478 >29 mg/dL Final   Triglycerides  Date Value Ref Range Status  10/21/2021 50 <150 mg/dL Final         Passed - Completed PHQ-2 or PHQ-9 in the last 360 days      Passed - Last BP in normal range    BP Readings from Last 1 Encounters:  07/14/22 132/70         Passed - Valid encounter within last 6 months    Recent Outpatient Visits           5 months ago Primary hypertension   [place] [facility] [place], Salvadore [place], NP   6 months ago Primary hypertension   [place] [facility] [place], Salvadore Oxford, NP    11 months ago Primary hypertension   [place] [facility] [place], Salvadore Oxford, NP   1 year ago Encounter for general adult medical examination with abnormal findings   [place] [facility] [place], Salvadore Oxford, NP   1 year ago Alcohol abuse   [place] [facility] [place], Salvadore Oxford, [place]

## 2023-01-08 ENCOUNTER — Other Ambulatory Visit: Payer: Self-pay | Admitting: Internal Medicine

## 2023-01-11 NOTE — Telephone Encounter (Signed)
Requested Prescriptions  Pending Prescriptions Disp Refills   losartan (COZAAR) 100 MG tablet [Pharmacy Med Name: LOSARTAN POTASSIUM 100 MG TAB] 30 tablet 0    Sig: TAKE 1 TABLET BY MOUTH DAILY     Cardiovascular:  Angiotensin Receptor Blockers Failed - 01/08/2023 11:33 AM      Failed - Cr in normal range and within 180 days    Creat  Date Value Ref Range Status  10/21/2021 0.90 0.50 - 1.05 mg/dL Final         Failed - K in normal range and within 180 days    Potassium  Date Value Ref Range Status  10/21/2021 3.9 3.5 - 5.3 mmol/L Final         Failed - Valid encounter within last 6 months    Recent Outpatient Visits           7 months ago Primary hypertension   [place] [facility] [place], Salvadore Oxford, NP   8 months ago Primary hypertension   [place] [facility] [place], Salvadore Oxford, NP   1 year ago Primary hypertension   [place] [facility] [place], Salvadore Oxford, NP   1 year ago Encounter for general adult medical examination with abnormal findings   [place] [facility] [place], Salvadore Oxford, NP   2 years ago Alcohol abuse   [place] [facility] [place], [place], [place]              Passed - Patient is not pregnant      Passed - Last BP in normal range    BP Readings from Last 1 Encounters:  07/14/22 132/70

## 2023-01-13 ENCOUNTER — Other Ambulatory Visit: Payer: Self-pay | Admitting: Internal Medicine

## 2023-01-13 NOTE — Telephone Encounter (Signed)
Request is too soon, last refill 01/11/23 for 30 days. Duplicate request.  Requested Prescriptions  Pending Prescriptions Disp Refills   losartan (COZAAR) 100 MG tablet [Pharmacy Med Name: LOSARTAN POTASSIUM 100 MG TAB] 90 tablet     Sig: TAKE 1 TABLET BY MOUTH DAILY     Cardiovascular:  Angiotensin Receptor Blockers Failed - 01/13/2023  9:51 AM      Failed - Cr in normal range and within 180 days    Creat  Date Value Ref Range Status  10/21/2021 0.90 0.50 - 1.05 mg/dL Final         Failed - K in normal range and within 180 days    Potassium  Date Value Ref Range Status  10/21/2021 3.9 3.5 - 5.3 mmol/L Final         Failed - Valid encounter within last 6 months    Recent Outpatient Visits           7 months ago Primary hypertension   [place] [facility] [place], Salvadore Oxford, NP   8 months ago Primary hypertension   [place] [facility] [place], Salvadore Oxford, NP   1 year ago Primary hypertension   [place] [facility] [place], Salvadore Oxford, NP   1 year ago Encounter for general adult medical examination with abnormal findings   [place] [facility] [place], Salvadore Oxford, NP   2 years ago Alcohol abuse   [place] [facility] [place], [place], [place]              Passed - Patient is not pregnant      Passed - Last BP in normal range    BP Readings from Last 1 Encounters:  07/14/22 132/70

## 2023-02-05 ENCOUNTER — Other Ambulatory Visit: Payer: Self-pay | Admitting: Internal Medicine

## 2023-02-06 NOTE — Telephone Encounter (Signed)
Requested medication (s) are due for refill today: yes  Requested medication (s) are on the active medication list: yes  Last refill:    Future visit scheduled: no  Notes to clinic:  Unable to refill per protocol, courtesy refill already given, routing for provider approval.      Requested Prescriptions  Pending Prescriptions Disp Refills   desvenlafaxine (PRISTIQ) 50 MG 24 hr tablet [Pharmacy Med Name: DESVENLAFAXINE SUCCNT ER 50 MG] 90 tablet 0    Sig: TAKE 1 TABLET BY MOUTH DAILY     Psychiatry: Antidepressants - SNRI - desvenlafaxine & venlafaxine Failed - 02/05/2023 10:47 AM      Failed - Cr in normal range and within 360 days    Creat  Date Value Ref Range Status  10/21/2021 0.90 0.50 - 1.05 mg/dL Final         Failed - Valid encounter within last 6 months    Recent Outpatient Visits           8 months ago Primary hypertension   [place], Salvadore Oxford, NP   9 months ago Primary hypertension   [place] [facility] [place], Salvadore Oxford, NP   1 year ago Primary hypertension   [place] [facility] [place], Salvadore Oxford, NP   1 year ago Encounter for general adult medical examination with abnormal findings   [place] [facility] [place], Salvadore Oxford, NP   2 years ago Alcohol abuse   [place] [facility] [place], [place], NP              Failed - Lipid Panel in normal range within the last 12 months    Cholesterol, Total  Date Value Ref Range Status  10/14/2016 220 (H) 100 - 199 mg/dL Final   Cholesterol  Date Value Ref Range Status  10/21/2021 253 (H) <200 mg/dL Final   LDL Cholesterol (Calc)  Date Value Ref Range Status  10/21/2021 94 mg/dL (calc) Final    Comment:    Reference range: <100 . Desirable range <100 mg/dL for primary prevention;   <70 mg/dL for patients with CHD or diabetic patients  with > or = 2 CHD risk factors. Marland Kitchen LDL-C is now calculated using  the Martin-Hopkins  calculation, which is a validated novel method providing  better accuracy than the Friedewald equation in the  estimation of LDL-C.  Horald Pollen et al. Lenox Ahr. 4782;956(21): 2061-2068  (http://education.[facility].com/faq/FAQ164)    HDL  Date Value Ref Range Status  10/21/2021 146 > OR = 50 mg/dL Final  30/86/5784 696 >29 mg/dL Final   Triglycerides  Date Value Ref Range Status  10/21/2021 50 <150 mg/dL Final         Passed - Completed PHQ-2 or PHQ-9 in the last 360 days      Passed - Last BP in normal range    BP Readings from Last 1 Encounters:  07/14/22 132/70

## 2023-02-22 DIAGNOSIS — I10 Essential (primary) hypertension: Secondary | ICD-10-CM | POA: Diagnosis not present

## 2023-02-22 DIAGNOSIS — J069 Acute upper respiratory infection, unspecified: Secondary | ICD-10-CM | POA: Diagnosis not present

## 2023-02-22 DIAGNOSIS — B9689 Other specified bacterial agents as the cause of diseases classified elsewhere: Secondary | ICD-10-CM | POA: Diagnosis not present

## 2023-02-22 DIAGNOSIS — R0602 Shortness of breath: Secondary | ICD-10-CM | POA: Diagnosis not present

## 2023-03-31 DIAGNOSIS — Z23 Encounter for immunization: Secondary | ICD-10-CM | POA: Diagnosis not present

## 2023-03-31 DIAGNOSIS — Z1231 Encounter for screening mammogram for malignant neoplasm of breast: Secondary | ICD-10-CM | POA: Diagnosis not present

## 2023-03-31 DIAGNOSIS — Z0000 Encounter for general adult medical examination without abnormal findings: Secondary | ICD-10-CM | POA: Diagnosis not present

## 2023-03-31 DIAGNOSIS — Z7689 Persons encountering health services in other specified circumstances: Secondary | ICD-10-CM | POA: Diagnosis not present

## 2023-03-31 DIAGNOSIS — Z131 Encounter for screening for diabetes mellitus: Secondary | ICD-10-CM | POA: Diagnosis not present

## 2023-03-31 DIAGNOSIS — Z1331 Encounter for screening for depression: Secondary | ICD-10-CM | POA: Diagnosis not present

## 2023-04-25 DIAGNOSIS — E785 Hyperlipidemia, unspecified: Secondary | ICD-10-CM | POA: Diagnosis not present

## 2023-04-25 DIAGNOSIS — D751 Secondary polycythemia: Secondary | ICD-10-CM | POA: Diagnosis not present

## 2023-04-25 DIAGNOSIS — E875 Hyperkalemia: Secondary | ICD-10-CM | POA: Diagnosis not present

## 2023-04-25 DIAGNOSIS — N1831 Chronic kidney disease, stage 3a: Secondary | ICD-10-CM | POA: Diagnosis not present

## 2023-04-25 DIAGNOSIS — I10 Essential (primary) hypertension: Secondary | ICD-10-CM | POA: Diagnosis not present

## 2023-08-24 DIAGNOSIS — Z1231 Encounter for screening mammogram for malignant neoplasm of breast: Secondary | ICD-10-CM | POA: Diagnosis not present
